# Patient Record
Sex: Female | Born: 1960 | Race: White | Hispanic: No | Marital: Single | Smoking: Current every day smoker
Health system: Southern US, Community
[De-identification: ages and names within clinical notes are randomized; demographics above are authoritative.]

## PROBLEM LIST (undated history)

## (undated) DIAGNOSIS — M1990 Unspecified osteoarthritis, unspecified site: Secondary | ICD-10-CM

## (undated) DIAGNOSIS — R519 Headache, unspecified: Secondary | ICD-10-CM

## (undated) DIAGNOSIS — R51 Headache: Secondary | ICD-10-CM

## (undated) DIAGNOSIS — R1900 Intra-abdominal and pelvic swelling, mass and lump, unspecified site: Secondary | ICD-10-CM

## (undated) HISTORY — PX: WISDOM TOOTH EXTRACTION: SHX21

---

## 2005-10-10 ENCOUNTER — Emergency Department: Payer: Self-pay | Admitting: Emergency Medicine

## 2016-12-16 ENCOUNTER — Encounter: Payer: Self-pay | Admitting: Advanced Practice Midwife

## 2016-12-16 ENCOUNTER — Ambulatory Visit (INDEPENDENT_AMBULATORY_CARE_PROVIDER_SITE_OTHER): Payer: 59 | Admitting: Advanced Practice Midwife

## 2016-12-16 VITALS — BP 120/70 | HR 71 | Ht 63.0 in | Wt 124.0 lb

## 2016-12-16 DIAGNOSIS — Z01419 Encounter for gynecological examination (general) (routine) without abnormal findings: Secondary | ICD-10-CM

## 2016-12-16 DIAGNOSIS — N9089 Other specified noninflammatory disorders of vulva and perineum: Secondary | ICD-10-CM | POA: Diagnosis not present

## 2016-12-16 DIAGNOSIS — Z124 Encounter for screening for malignant neoplasm of cervix: Secondary | ICD-10-CM

## 2016-12-16 NOTE — Progress Notes (Signed)
Patient ID: Tiffany Kelley, female   DOB: 1960/07/11, 56 y.o.   MRN: [id number]     Gynecology Annual Exam  PCP: Patient, No Pcp Per  Chief Complaint:  Chief Complaint  Patient presents with  . Gynecologic Exam    History of Present Illness:Patient is a 56 y.o. G0P0000 presents for annual exam. The patient has complaints today of 2 small skin tags on the perineum. She requests that they be removed.   LMP: No LMP recorded. Patient is post menopausal Menarche:not applicable Postcoital Bleeding: no Dysmenorrhea: not applicable   The patient is sexually active. She denies dyspareunia.  The patient does perform self breast exams.  There is no notable family history of breast or ovarian cancer in her family.  The patient wears seatbelts: yes.   The patient has regular exercise: yes.    The patient denies current symptoms of depression.     Review of Systems: Review of Systems  Constitutional: Negative.   HENT: Negative.   Eyes: Negative.   Respiratory: Negative.   Cardiovascular: Negative.   Gastrointestinal: Negative.   Genitourinary: Negative.   Musculoskeletal: Negative.   Skin: Negative.   Neurological: Negative.   Endo/Heme/Allergies: Negative.   Psychiatric/Behavioral: Negative.     Past Medical History:  History reviewed. No pertinent past medical history.  Past Surgical History:  History reviewed. No pertinent surgical history.  Gynecologic History:  No LMP recorded. Last Pap: about 15 years ago Results were:  no abnormalities  Last mammogram: 4 years ago Results were: BI-RAD I Obstetric History: G0P0000  Family History:  History reviewed. No pertinent family history.  Social History:  Social History   Social History  . Marital status: Single    Spouse name: N/A  . Number of children: N/A  . Years of education: N/A   Occupational History  . Not on file.   Social History Main Topics  . Smoking status: Current Every Day Smoker  . Smokeless  tobacco: Never Used  . Alcohol use No  . Drug use: No  . Sexual activity: Not on file   Other Topics Concern  . Not on file   Social History Narrative  . No narrative on file    Allergies:  No Known Allergies  Medications: Prior to Admission medications   Not on File    Physical Exam Vitals: Blood pressure 120/70, pulse 71, height 5\' 3"  (1.6 m), weight 124 lb (56.2 kg).  General: NAD HEENT: normocephalic, anicteric Thyroid: no enlargement, no palpable nodules Pulmonary: No increased work of breathing, CTAB Cardiovascular: RRR, distal pulses 2+ Breast: Breast symmetrical, no tenderness, no palpable nodules or masses, no skin or nipple retraction present, no nipple discharge.  No axillary or supraclavicular lymphadenopathy. Abdomen: NABS, soft, non-tender, non-distended.  Umbilicus without lesions.  No hepatomegaly, splenomegaly or masses palpable. No evidence of hernia  Genitourinary:  External: Normal external female genitalia.  Normal urethral meatus, normal,   Bartholin's and Skene's glands. 3 small skin tags removed with good results. 1 stitch                      placed with hemostasis noted.   Vagina: Normal vaginal mucosa, no evidence of prolapse.    Cervix: Grossly normal in appearance, no bleeding, no CMT  Uterus: Non-enlarged, mobile, normal contour.    Adnexa: ovaries non-enlarged, no adnexal masses  Rectal: deferred  Lymphatic: no evidence of inguinal lymphadenopathy Extremities: no edema, erythema, or tenderness Neurologic: Grossly intact Psychiatric: mood appropriate,  affect full    Assessment: 57 y.o. G0P0000 routine annual exam  Plan: Problem List Items Addressed This Visit    None    Visit Diagnoses    Well woman exam with routine gynecological exam    -  Primary   Relevant Orders   IGP, Aptima HPV   Ambulatory referral to Gastroenterology   Cervical cancer screening       Relevant Orders   IGP, Aptima HPV      1) Mammogram - recommend  yearly screening mammogram.  Mammogram patient declines mammogram at this time  2) STI screening was offered and declined  3) ASCCP guidelines and rational discussed.  Patient opts for occasional screening interval  4) Osteoporosis  - per USPTF routine screening DEXA at age 64   Consider FDA-approved medical therapies in postmenopausal women and men aged 36 years and older, based on the following: a) A hip or vertebral (clinical or morphometric) fracture b) T-score ? -2.5 at the femoral neck or spine after appropriate evaluation to exclude secondary causes C) Low bone mass (T-score between -1.0 and -2.5 at the femoral neck or spine) and a 10-year probability of a hip fracture ? 3% or a 10-year probability of a major osteoporosis-related fracture ? 20% based on the [place]-adapted WHO algorithm   5) Routine healthcare maintenance including cholesterol, diabetes screening discussed Declines  6) Colonoscopy: referral sent today.  Screening recommended starting at age 24 for average risk individuals, age 49 for individuals deemed at increased risk (including African Americans) and recommended to continue until age 83.  For patient age 70-85 individualized approach is recommended.  Gold standard screening is via colonoscopy, Cologuard screening is an acceptable alternative for patient unwilling or unable to undergo colonoscopy.  "Colorectal cancer screening for average?risk adults: 2018 guideline update from the American Cancer Society"[place]: A Cancer Journal for Clinicians: Aug 19, 2016   7) Follow up 1 year for routine annual  Rod Can, [place]

## 2016-12-18 LAB — IGP, APTIMA HPV
HPV Aptima: NEGATIVE
PAP Smear Comment: 0

## 2017-01-29 ENCOUNTER — Telehealth: Payer: Self-pay | Admitting: Gastroenterology

## 2017-01-29 NOTE — Telephone Encounter (Signed)
PCP is wanting this procedure schedule.

## 2017-02-02 ENCOUNTER — Telehealth: Payer: Self-pay

## 2017-02-02 NOTE — Telephone Encounter (Signed)
LVM with family member for pt callback.   Pt attempting to schedule colonoscopy.

## 2017-06-27 DIAGNOSIS — N949 Unspecified condition associated with female genital organs and menstrual cycle: Secondary | ICD-10-CM

## 2017-06-27 DIAGNOSIS — F17210 Nicotine dependence, cigarettes, uncomplicated: Secondary | ICD-10-CM | POA: Diagnosis not present

## 2017-06-27 DIAGNOSIS — R102 Pelvic and perineal pain: Secondary | ICD-10-CM | POA: Diagnosis not present

## 2017-06-28 ENCOUNTER — Emergency Department: Payer: 59

## 2017-06-28 ENCOUNTER — Other Ambulatory Visit: Payer: Self-pay | Admitting: Obstetrics and Gynecology

## 2017-06-28 ENCOUNTER — Emergency Department
Admission: EM | Admit: 2017-06-28 | Discharge: 2017-06-28 | Disposition: A | Discharge status: Home / Self Care | Payer: 59 | Attending: Emergency Medicine | Admitting: Emergency Medicine

## 2017-06-28 ENCOUNTER — Other Ambulatory Visit: Payer: Self-pay

## 2017-06-28 DIAGNOSIS — R52 Pain, unspecified: Secondary | ICD-10-CM

## 2017-06-28 DIAGNOSIS — N838 Other noninflammatory disorders of ovary, fallopian tube and broad ligament: Secondary | ICD-10-CM | POA: Insufficient documentation

## 2017-06-28 DIAGNOSIS — N9489 Other specified conditions associated with female genital organs and menstrual cycle: Secondary | ICD-10-CM

## 2017-06-28 DIAGNOSIS — F17210 Nicotine dependence, cigarettes, uncomplicated: Secondary | ICD-10-CM | POA: Insufficient documentation

## 2017-06-28 DIAGNOSIS — R1031 Right lower quadrant pain: Secondary | ICD-10-CM | POA: Diagnosis present

## 2017-06-28 DIAGNOSIS — N949 Unspecified condition associated with female genital organs and menstrual cycle: Secondary | ICD-10-CM | POA: Diagnosis not present

## 2017-06-28 DIAGNOSIS — R102 Pelvic and perineal pain: Secondary | ICD-10-CM | POA: Diagnosis not present

## 2017-06-28 LAB — COMPREHENSIVE METABOLIC PANEL
ALBUMIN: 4.2 g/dL (ref 3.5–5.0)
ALK PHOS: 110 U/L (ref 38–126)
ALT: 25 U/L (ref 14–54)
AST: 34 U/L (ref 15–41)
Anion gap: 11 (ref 5–15)
BILIRUBIN TOTAL: 0.5 mg/dL (ref 0.3–1.2)
BUN: 16 mg/dL (ref 6–20)
CALCIUM: 9.3 mg/dL (ref 8.9–10.3)
CO2: 21 mmol/L — ABNORMAL LOW (ref 22–32)
Chloride: 107 mmol/L (ref 101–111)
Creatinine, Ser: 0.84 mg/dL (ref 0.44–1.00)
GFR calc Af Amer: >60 mL/min (ref >60)
GFR calc non Af Amer: >60 mL/min (ref >60)
GLUCOSE: 129 mg/dL — AB (ref 65–99)
Potassium: 3.3 mmol/L — ABNORMAL LOW (ref 3.5–5.1)
Sodium: 139 mmol/L (ref 135–145)
TOTAL PROTEIN: 7.2 g/dL (ref 6.5–8.1)

## 2017-06-28 LAB — CBC
HCT: 40.7 % (ref 35.0–47.0)
Hemoglobin: 13.8 g/dL (ref 12.0–16.0)
MCH: 30.8 pg (ref 26.0–34.0)
MCHC: 34 g/dL (ref 32.0–36.0)
MCV: 90.8 fL (ref 80.0–100.0)
PLATELETS: 171 10*3/uL (ref 150–440)
RBC: 4.48 MIL/uL (ref 3.80–5.20)
RDW: 13.5 % (ref 11.5–14.5)
WBC: 7.6 10*3/uL (ref 3.6–11.0)

## 2017-06-28 LAB — URINALYSIS, COMPLETE (UACMP) WITH MICROSCOPIC
Bacteria, UA: NONE SEEN
Bilirubin Urine: NEGATIVE
GLUCOSE, UA: NEGATIVE mg/dL
Hgb urine dipstick: NEGATIVE
Ketones, ur: 5 mg/dL — AB
Leukocytes, UA: NEGATIVE
Nitrite: NEGATIVE
Protein, ur: NEGATIVE mg/dL
SPECIFIC GRAVITY, URINE: 1.017 (ref 1.005–1.030)
WBC, UA: NONE SEEN WBC/hpf (ref 0–5)
pH: 6 (ref 5.0–8.0)

## 2017-06-28 LAB — LIPASE, BLOOD: Lipase: 30 U/L (ref 11–51)

## 2017-06-28 MED ORDER — ONDANSETRON 4 MG PO TBDP: 4.0000 mg | ORAL_TABLET | Freq: Three times a day (TID) | ORAL | 0 refills | Status: DC | PRN

## 2017-06-28 MED ORDER — ONDANSETRON HCL 4 MG/2ML IJ SOLN
4.0000 mg | Freq: Once | INTRAMUSCULAR | Status: AC | PRN
  Administered 2017-06-28: 4 mg via INTRAVENOUS
  Filled 2017-06-28: qty 2

## 2017-06-28 MED ORDER — ONDANSETRON HCL 4 MG/2ML IJ SOLN
4.0000 mg | Freq: Once | INTRAMUSCULAR | Status: AC
  Administered 2017-06-28: 4 mg via INTRAVENOUS
  Filled 2017-06-28: qty 2

## 2017-06-28 MED ORDER — HYDROMORPHONE HCL 1 MG/ML IJ SOLN
0.5000 mg | Freq: Once | INTRAMUSCULAR | Status: AC
  Administered 2017-06-28: 0.5 mg via INTRAVENOUS

## 2017-06-28 MED ORDER — HYDROMORPHONE HCL 1 MG/ML IJ SOLN
INTRAMUSCULAR | Status: AC
  Filled 2017-06-28: qty 1

## 2017-06-28 MED ORDER — SODIUM CHLORIDE 0.9 % IV BOLUS
1000.0000 mL | Freq: Once | INTRAVENOUS | Status: AC
  Administered 2017-06-28: 1000 mL via INTRAVENOUS

## 2017-06-28 MED ORDER — HYDROMORPHONE HCL 1 MG/ML IJ SOLN
1.0000 mg | Freq: Once | INTRAMUSCULAR | Status: AC
  Administered 2017-06-28: 1 mg via INTRAVENOUS
  Filled 2017-06-28: qty 1

## 2017-06-28 MED ORDER — OXYCODONE-ACETAMINOPHEN 5-325 MG PO TABS: 1.0000 | ORAL_TABLET | ORAL | 0 refills | Status: DC | PRN

## 2017-06-28 NOTE — ED Notes (Signed)
Pt returned from ultrasound

## 2017-06-28 NOTE — ED Provider Notes (Addendum)
-----------------------------------------   10:05 AM on 06/28/2017 -----------------------------------------  I discussed the ultrasound/CT results with the patient.  I discussed the patient with Dr. Prentice Docker of OB/GYN.  Patient states her pain is down to a 3/10 but it continues to come back and be intermittent.  Due to the severe pain this morning patient wishes to stay in the emergency department to speak with Dr. Glennon Mac after he is done at clinic which should be around 12 PM per Dr. Glennon Mac.  Patient agreeable to this plan of care.   Harvest Dark, MD 06/28/17 1006   ----------------------------------------- 1:09 PM on 06/28/2017 -----------------------------------------  Patient has been seen by Dr. Glennon Mac, okay for discharge home with follow-up in the office on Wednesday to gynecology oncology.  We will discharge with pain and nausea medication.  We will also discharged with a work note.  Patient agreeable to this plan of care.  I discussed these plans with the patient.   Harvest Dark, MD 06/28/17 1310

## 2017-06-28 NOTE — Consult Note (Signed)
GYNECOLOGY CONSULT NOTE  GYN Consultation  Attending Provider: Harvest Dark, MD  Madysen Faircloth [phone] 06/28/2017 7:39 PM    Reason for Consultation:   Tiffany Kelley is a 57 y.o. G0P0000 postmenopausal female seen at the request of Dr. Kerman Passey for evaluation of a pelvic mass.    History of Present Ilness:   The patient was in her usual state of good health until last night when she began to have a twinge in her right side.  She went to bed, but was awakened by severe right lower quadrant abdominal pain that radiated around her right side to her right lower back. The pain in her right lower quadrant was 8/10 and in her back it was 3-4/10.  The pain was described as sharp and stabbing in her right lower quadrant and as a dull ache in her back. The pain would come and ago and was associated with intermittent nausea and emesis.  By 4am today she was concerned and so presented to the ER.  Lying in the fetal position, but facing the mattress seemed to make the pain a little better. Nothing seemed to make it worse.  Apart from nausea, she has had no other symptoms. She denies recently weight loss, bloating, early satiety, and constipation.   She received dilaudid for pain control in the ED and has felt much better, she requires nothing at the moment for pain.   She has not had a menses in 10 years. She is in a same-sex relationship and is a [place] [employer] [place].  She had a recent pap smear in 11/2016, which was normal.  Denies a history of abnormal pap smears.   Past Medical History: Denies   Past Surgical History: Denies  Allergies: No Known Allergies   Prior to Admission medications: denies   Obstetric History: She is a G0P0000.    Social History:  She  reports that she has been smoking.  She has never used smokeless tobacco. She reports that she does not drink alcohol or use drugs.  Family History:  Breast cancer in her maternal aunt. Denies other family history of  neoplasia.   Review of Systems:   Review of Systems  Constitutional: Negative.   HENT: Negative.   Eyes: Negative.   Respiratory: Negative.   Cardiovascular: Negative.   Gastrointestinal: Positive for abdominal pain, nausea and vomiting. Negative for blood in stool, constipation, diarrhea, heartburn and melena.  Genitourinary: Positive for flank pain and frequency. Negative for dysuria, hematuria and urgency.  Skin: Negative.   Neurological: Negative.   Endo/Heme/Allergies: Negative.   Psychiatric/Behavioral: Negative.      Objective    BP 105/61   Pulse (!) 48   Temp 97.7 F (36.5 C) (Oral)   Resp 18   Ht 5\' 3"  (1.6 m)   Wt 122 lb (55.3 kg)   SpO2 95%   BMI 21.61 kg/m  Physical Exam  Physical Exam  Constitutional: She is oriented to person, place, and time. She appears well-developed and well-nourished. She does not appear ill.  HENT:  Head: Normocephalic and atraumatic.  Eyes: EOM are normal. No scleral icterus.  Cardiovascular: Normal rate and regular rhythm. Exam reveals no gallop.  No murmur heard. Pulmonary/Chest: Effort normal and breath sounds normal. No respiratory distress. She has no wheezes. She has no rhonchi. She has no rales. She exhibits no tenderness.  Abdominal: Soft. Normal appearance and bowel sounds are normal. She exhibits mass (mid-lower abdomen to right lower abdomen). She exhibits no  distension, no fluid wave, no ascites and no pulsatile midline mass. There is tenderness (mild ttp) in the right lower quadrant. There is no rigidity, no rebound, no guarding and no CVA tenderness.  Genitourinary:  Genitourinary Comments: Deferred in favor follow up pelvic exam in clinic  Neurological: She is alert and oriented to person, place, and time.  Skin: Skin is warm and dry. She is not diaphoretic.  Psychiatric: She has a normal mood and affect. Her behavior is normal.   Laboratory Results:   Lab Results  Component Value Date   WBC 7.6 06/28/2017   RBC  4.48 06/28/2017   HGB 13.8 06/28/2017   HCT 40.7 06/28/2017   PLT 171 06/28/2017   NA 139 06/28/2017   K 3.3 (L) 06/28/2017   CREATININE 0.84 06/28/2017   Imaging Results:  Ct Renal Stone Study  Result Date: 06/28/2017 CLINICAL DATA:  57 y/o  F; right flank pain. EXAM: CT ABDOMEN AND PELVIS WITHOUT CONTRAST TECHNIQUE: Multidetector CT imaging of the abdomen and pelvis was performed following the standard protocol without IV contrast. COMPARISON:  None. FINDINGS: Lower chest: No acute abnormality. Hepatobiliary: Subcentimeter lucency in segment 5 (series 2, image 30), likely a cyst. Otherwise no focal liver abnormality is seen. No gallstones, gallbladder wall thickening, or biliary dilatation. Pancreas: Unremarkable. No pancreatic ductal dilatation or surrounding inflammatory changes. Spleen: Normal in size without focal abnormality. Adrenals/Urinary Tract: Adrenal glands are unremarkable. Subcentimeter lucency within the upper pole of left kidney, likely cyst. Otherwise kidneys are normal, without renal calculi, focal lesion, or hydronephrosis. Bladder is unremarkable. Stomach/Bowel: Stomach is within normal limits. Appendix appears normal. No evidence of bowel wall thickening, distention, or inflammatory changes. Vascular/Lymphatic: Aortic atherosclerosis. No enlarged abdominal or pelvic lymph nodes. Reproductive: Multiloculated cystic mass, likely arising from the right adnexa with thick septations and solid components in the wall measuring up to 12.9 x 10.4 x 6.9 cm. Other: No abdominal wall hernia or abnormality. No abdominopelvic ascites. Musculoskeletal: No fracture is seen. Mild lumbar spondylosis and transitional L5 vertebral body. IMPRESSION: 1. Complex cystic mass in the pelvis likely arising from right adnexa measuring up to 12.9 cm. Surgical consultation is recommended. 2. No acute process identified. No urinary stone disease or hydronephrosis. Electronically Signed   By: Kristine Garbe M.D.   On: 06/28/2017 06:58   US Pelvic Complete With Transvaginal  Result Date: 06/28/2017 CLINICAL DATA:  Pelvic pain EXAM: TRANSABDOMINAL AND TRANSVAGINAL ULTRASOUND OF PELVIS TECHNIQUE: Study was performed transabdominally to optimize pelvic field of view evaluation and transvaginally to optimize internal visceral architecture evaluation. COMPARISON:  None FINDINGS: Uterus Measurements: 5.0 x 1.6 x 2.2 cm uterus is compressed by a large adnexal mass. No intrauterine lesion evident. Endometrium Endometrium not well seen due to compressed uterus. There does not appear to be endometrial thickening. Right ovary Not visualized as separate structure. Left ovary Not visualized as separate structure. Other findings There is a midline complex predominantly cystic mass measuring 12.7 x 8.1 x 12.0 cm. This complex mass impresses upon the uterus and may displace ovaries such that they are not visualized by either transabdominal or transvaginal technique. No other well-defined pelvic mass evident. A small amount of adjacent free fluid is noted. IMPRESSION: Complex mass in the midline of the pelvis measuring 12.7 x 8.1 x 12.0 cm. It is not possible on this study to ascertain whether arises from the right versus left side. It impresses upon the uterus. 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 A large endometrioma or hemorrhagic cystic lesion are differential considerations. Small amount of free pelvic fluid noted. Given the overall nature of this lesion and its displacement of other structures, it may be prudent to consider correlation with CT or MR for further assessment. CT or MR potentially could localize right versus left side as origin of this large complex lesion. Electronically Signed   By: Lowella Grip III M.D.   On: 06/28/2017 08:53     Assessment & Recommendations   Berneta Sconyers is a 57 y.o. G0P0000  postmenopausal female with a newly diagnosed pelvic mass, possibly arising from the right ovary, but not definitively on imaging. I discussed with the patient and her partner all findings. We discussed that the mass is complicated in appearance. However, there is no way to definitively say whether the mass is malignant without removal.  Reassuring findings are that there appears to be no significant ascites, omental caking, or obvious para-aortic or pelvic lymph nodes (though the CT protocol may have limited the ability to appreciate these areas fully).  On ultrasound the lesion has a cystic component and a ground-glass component.  It is difficult to say whether there is one cyst verses more than one.  There is doppler flow on what appears to be a septation. The cystic portion does also have small papillary projections, making the findings more concerning.   Recommendations:  1.  I will refer the patient to the Gynecologic Oncology clinic ASAP for further evaluation and consideration of surgical removal. I have added tumor markers to the patients labs. 2.  OK to send the patient home with a small amount of pain medication to help her with pain for today. 3.  Precautions given regarding torsion and she is to return immediately to the ER should she develop intractable pain or other concerning symptoms.   4. She and her partner voiced understanding of the findings as I have explained them and all questions were answered.  I reviewed all images and labs and further workup is planned by way of a surgical route, most likely, in consultation with gynecologic oncology.  Prentice Docker, MD 06/28/2017 7:39 PM

## 2017-06-28 NOTE — ED Triage Notes (Signed)
Pt reports one episode of sharp pain to right lower quadrant Sunday morning; had a dull aching pain all day; around 2am woke up vomiting; pt says pain starts lower mid abd and wraps around flank to right mid back; denies urinary symptoms; denies history of stone;

## 2017-06-28 NOTE — ED Provider Notes (Signed)
[facility] Emergency Department Provider Note   ____________________________________________   First MD Initiated Contact with Patient 06/28/17 [phone]     (approximate)  I have reviewed the triage vital signs and the nursing notes.   HISTORY  Chief Complaint Abdominal Pain and Back Pain    HPI Tiffany Kelley is a 57 y.o. female who presents to the ED from home with a chief complaint of right lower quadrant and right flank pain.  Patient reports she had a sudden episode of sharp right lower quadrant abdominal pain yesterday morning.  The pain quickly disappeared and she went about her day.  She had a dull aching pain in her right flank all day.  She was able to urinate freely all day without hematuria and ate a good dinner.  Woke up around 2 AM with right flank and lower quadrant abdominal pain associated with vomiting.  Denies vaginal discharge or bleeding. Denies associated fever, chills, chest pain, shortness of breath, diarrhea.  Had a very satisfying bowel movement last evening.  Denies recent travel or trauma.  Denies personal history of kidney stones.   Past medical history None  There are no active problems to display for this patient.   Past surgical history None  Prior to Admission medications   Not on File    Allergies Patient has no known allergies.  No family history on file.  Social History Social History   Tobacco Use  . Smoking status: Current Every Day Smoker  . Smokeless tobacco: Never Used  Substance Use Topics  . Alcohol use: No  . Drug use: No    Review of Systems  Constitutional: No fever/chills. Eyes: No visual changes. ENT: No sore throat. Cardiovascular: Denies chest pain. Respiratory: Denies shortness of breath. Gastrointestinal: Positive for right flank and abdominal pain.  Positive for vomiting.  No diarrhea.  No constipation. Genitourinary: Negative for dysuria. Musculoskeletal: Negative for back  pain. Skin: Negative for rash. Neurological: Negative for headaches, focal weakness or numbness.   ____________________________________________   PHYSICAL EXAM:  VITAL SIGNS: ED Triage Vitals  Enc Vitals Group     BP 06/28/17 0457 (!) 147/77     Pulse Rate 06/28/17 0457 (!) 56     Resp 06/28/17 0457 18     Temp 06/28/17 0457 97.7 F (36.5 C)     Temp Source 06/28/17 0457 Oral     SpO2 06/28/17 0457 100 %     Weight 06/28/17 0457 122 lb (55.3 kg)     Height 06/28/17 0457 5\' 3"  (1.6 m)     Head Circumference --      Peak Flow --      Pain Score 06/28/17 0506 8     Pain Loc --      Pain Edu? --      Excl. in [place]? --     Constitutional: Alert and oriented. Well appearing and in moderate acute distress.  Rocking back and forth in the bed with her partner rubbing her back. Eyes: Conjunctivae are normal. PERRL. EOMI. Head: Atraumatic. Nose: No congestion/rhinnorhea. Mouth/Throat: Mucous membranes are moist.  Oropharynx non-erythematous. Neck: No stridor.   Cardiovascular: Normal rate, regular rhythm. Grossly normal heart sounds.  Good peripheral circulation. Respiratory: Normal respiratory effort.  No retractions. Lungs CTAB. Gastrointestinal: Soft and moderately tender to palpation right lower quadrant without rebound or guarding. No distention. No abdominal bruits. No CVA tenderness. Musculoskeletal: No lower extremity tenderness nor edema.  No joint effusions. Neurologic:  Normal speech  and language. No gross focal neurologic deficits are appreciated. No gait instability. Skin:  Skin is warm, dry and intact. No rash noted. Psychiatric: Mood and affect are normal. Speech and behavior are normal.  ____________________________________________   LABS (all labs ordered are listed, but only abnormal results are displayed)  Labs Reviewed  COMPREHENSIVE METABOLIC PANEL - Abnormal; Notable for the following components:      Result Value   Potassium 3.3 (*)    CO2 21 (*)     Glucose, Bld 129 (*)    All other components within normal limits  LIPASE, BLOOD  CBC  URINALYSIS, COMPLETE (UACMP) WITH MICROSCOPIC   ____________________________________________  EKG  None ____________________________________________  RADIOLOGY  ED MD interpretation: CT with large right cystic adnexal mass; normal appendix; no kidney stones  Official radiology report(s): Ct Renal Stone Study  Result Date: 06/28/2017 CLINICAL DATA:  57 y/o  F; right flank pain. EXAM: CT ABDOMEN AND PELVIS WITHOUT CONTRAST TECHNIQUE: Multidetector CT imaging of the abdomen and pelvis was performed following the standard protocol without IV contrast. COMPARISON:  None. FINDINGS: Lower chest: No acute abnormality. Hepatobiliary: Subcentimeter lucency in segment 5 (series 2, image 30), likely a cyst. Otherwise no focal liver abnormality is seen. No gallstones, gallbladder wall thickening, or biliary dilatation. Pancreas: Unremarkable. No pancreatic ductal dilatation or surrounding inflammatory changes. Spleen: Normal in size without focal abnormality. Adrenals/Urinary Tract: Adrenal glands are unremarkable. Subcentimeter lucency within the upper pole of left kidney, likely cyst. Otherwise kidneys are normal, without renal calculi, focal lesion, or hydronephrosis. Bladder is unremarkable. Stomach/Bowel: Stomach is within normal limits. Appendix appears normal. No evidence of bowel wall thickening, distention, or inflammatory changes. Vascular/Lymphatic: Aortic atherosclerosis. No enlarged abdominal or pelvic lymph nodes. Reproductive: Multiloculated cystic mass, likely arising from the right adnexa with thick septations and solid components in the wall measuring up to 12.9 x 10.4 x 6.9 cm. Other: No abdominal wall hernia or abnormality. No abdominopelvic ascites. Musculoskeletal: No fracture is seen. Mild lumbar spondylosis and transitional L5 vertebral body. IMPRESSION: 1. Complex cystic mass in the pelvis likely  arising from right adnexa measuring up to 12.9 cm. Surgical consultation is recommended. 2. No acute process identified. No urinary stone disease or hydronephrosis. Electronically Signed   By: Kristine Garbe M.D.   On: 06/28/2017 06:58    ____________________________________________   PROCEDURES  Procedure(s) performed:  None  Procedures  Critical Care performed: No  ____________________________________________   INITIAL IMPRESSION / ASSESSMENT AND PLAN / ED COURSE  As part of my medical decision making, I reviewed the following data within the [facility] History obtained from family, Nursing notes reviewed and incorporated, Labs reviewed, Radiograph reviewed and Notes from prior ED visits   57 year old female who presents with right flank to right lower quadrant abdominal pain. Differential diagnosis includes, but is not limited to, ovarian cyst, ovarian torsion, acute appendicitis, diverticulitis, urinary tract infection/pyelonephritis, endometriosis, bowel obstruction, colitis, renal colic, gastroenteritis, hernia, fibroids, endometriosis, pregnancy related pain including ectopic pregnancy, etc.   Laboratory results unremarkable.  Patient has not yet urinated to provide a specimen.  Pain returning after initial dose of Dilaudid.  Clinical picture more suggestive of renal colic.  Will proceed with CT renal colic protocol.  Clinical Course as of Jun 29 723  Mon Jun 28, 2017  0723 Updated patient and her partner of CT imaging results.  Plan to proceed with pelvic ultrasound followed by [facility] consultation.  Care and disposition transferred to Dr. Kerman Passey.   [  JS]    Clinical Course User Index [JS] Paulette Blanch, MD     ____________________________________________   FINAL CLINICAL IMPRESSION(S) / ED DIAGNOSES  Final diagnoses:  Pain  Pelvic pain in female  Adnexal mass     ED Discharge Orders    None       Note:  This  document was prepared using Dragon voice recognition software and may include unintentional dictation errors.    Paulette Blanch, MD 06/28/17 [phone]

## 2017-06-28 NOTE — Discharge Instructions (Addendum)
Please follow-up with gynecology/oncology on Wednesday as scheduled.  Return to the emergency department for any acute worsening of pain or if you are unable to tolerate the pain at home.  Please take your pain and nausea medication as needed, as prescribed.

## 2017-06-28 NOTE — ED Notes (Signed)
Ultrasound states they require a urine pregnancy, pt states she is homosexual and there is no way she is pregnant and refuses to pay for a urine pregnancy test. Informed ultrasound.Marland Kitchen

## 2017-06-28 NOTE — H&P (View-Only) (Signed)
GYNECOLOGY CONSULT NOTE  GYN Consultation  Attending Provider: Harvest Dark, MD  Tiffany Kelley [phone] 06/28/2017 7:39 PM    Reason for Consultation:   Tiffany Kelley is a 57 y.o. G0P0000 postmenopausal female seen at the request of Dr. Kerman Passey for evaluation of a pelvic mass.    History of Present Ilness:   The patient was in her usual state of good health until last night when she began to have a twinge in her right side.  She went to bed, but was awakened by severe right lower quadrant abdominal pain that radiated around her right side to her right lower back. The pain in her right lower quadrant was 8/10 and in her back it was 3-4/10.  The pain was described as sharp and stabbing in her right lower quadrant and as a dull ache in her back. The pain would come and ago and was associated with intermittent nausea and emesis.  By 4am today she was concerned and so presented to the ER.  Lying in the fetal position, but facing the mattress seemed to make the pain a little better. Nothing seemed to make it worse.  Apart from nausea, she has had no other symptoms. She denies recently weight loss, bloating, early satiety, and constipation.   She received dilaudid for pain control in the ED and has felt much better, she requires nothing at the moment for pain.   She has not had a menses in 10 years. She is in a same-sex relationship and is a [place] [employer] [place].  She had a recent pap smear in 11/2016, which was normal.  Denies a history of abnormal pap smears.   Past Medical History: Denies   Past Surgical History: Denies  Allergies: No Known Allergies   Prior to Admission medications: denies   Obstetric History: She is a G0P0000.    Social History:  She  reports that she has been smoking.  She has never used smokeless tobacco. She reports that she does not drink alcohol or use drugs.  Family History:  Breast cancer in her maternal aunt. Denies other family history of  neoplasia.   Review of Systems:   Review of Systems  Constitutional: Negative.   HENT: Negative.   Eyes: Negative.   Respiratory: Negative.   Cardiovascular: Negative.   Gastrointestinal: Positive for abdominal pain, nausea and vomiting. Negative for blood in stool, constipation, diarrhea, heartburn and melena.  Genitourinary: Positive for flank pain and frequency. Negative for dysuria, hematuria and urgency.  Skin: Negative.   Neurological: Negative.   Endo/Heme/Allergies: Negative.   Psychiatric/Behavioral: Negative.      Objective    BP 105/61   Pulse (!) 48   Temp 97.7 F (36.5 C) (Oral)   Resp 18   Ht 5\' 3"  (1.6 m)   Wt 122 lb (55.3 kg)   SpO2 95%   BMI 21.61 kg/m  Physical Exam  Physical Exam  Constitutional: She is oriented to person, place, and time. She appears well-developed and well-nourished. She does not appear ill.  HENT:  Head: Normocephalic and atraumatic.  Eyes: EOM are normal. No scleral icterus.  Cardiovascular: Normal rate and regular rhythm. Exam reveals no gallop.  No murmur heard. Pulmonary/Chest: Effort normal and breath sounds normal. No respiratory distress. She has no wheezes. She has no rhonchi. She has no rales. She exhibits no tenderness.  Abdominal: Soft. Normal appearance and bowel sounds are normal. She exhibits mass (mid-lower abdomen to right lower abdomen). She exhibits no  distension, no fluid wave, no ascites and no pulsatile midline mass. There is tenderness (mild ttp) in the right lower quadrant. There is no rigidity, no rebound, no guarding and no CVA tenderness.  Genitourinary:  Genitourinary Comments: Deferred in favor follow up pelvic exam in clinic  Neurological: She is alert and oriented to person, place, and time.  Skin: Skin is warm and dry. She is not diaphoretic.  Psychiatric: She has a normal mood and affect. Her behavior is normal.   Laboratory Results:   Lab Results  Component Value Date   WBC 7.6 06/28/2017   RBC  4.48 06/28/2017   HGB 13.8 06/28/2017   HCT 40.7 06/28/2017   PLT 171 06/28/2017   NA 139 06/28/2017   K 3.3 (L) 06/28/2017   CREATININE 0.84 06/28/2017   Imaging Results:  Ct Renal Stone Study  Result Date: 06/28/2017 CLINICAL DATA:  57 y/o  F; right flank pain. EXAM: CT ABDOMEN AND PELVIS WITHOUT CONTRAST TECHNIQUE: Multidetector CT imaging of the abdomen and pelvis was performed following the standard protocol without IV contrast. COMPARISON:  None. FINDINGS: Lower chest: No acute abnormality. Hepatobiliary: Subcentimeter lucency in segment 5 (series 2, image 30), likely a cyst. Otherwise no focal liver abnormality is seen. No gallstones, gallbladder wall thickening, or biliary dilatation. Pancreas: Unremarkable. No pancreatic ductal dilatation or surrounding inflammatory changes. Spleen: Normal in size without focal abnormality. Adrenals/Urinary Tract: Adrenal glands are unremarkable. Subcentimeter lucency within the upper pole of left kidney, likely cyst. Otherwise kidneys are normal, without renal calculi, focal lesion, or hydronephrosis. Bladder is unremarkable. Stomach/Bowel: Stomach is within normal limits. Appendix appears normal. No evidence of bowel wall thickening, distention, or inflammatory changes. Vascular/Lymphatic: Aortic atherosclerosis. No enlarged abdominal or pelvic lymph nodes. Reproductive: Multiloculated cystic mass, likely arising from the right adnexa with thick septations and solid components in the wall measuring up to 12.9 x 10.4 x 6.9 cm. Other: No abdominal wall hernia or abnormality. No abdominopelvic ascites. Musculoskeletal: No fracture is seen. Mild lumbar spondylosis and transitional L5 vertebral body. IMPRESSION: 1. Complex cystic mass in the pelvis likely arising from right adnexa measuring up to 12.9 cm. Surgical consultation is recommended. 2. No acute process identified. No urinary stone disease or hydronephrosis. Electronically Signed   By: Kristine Garbe M.D.   On: 06/28/2017 06:58   US Pelvic Complete With Transvaginal  Result Date: 06/28/2017 CLINICAL DATA:  Pelvic pain EXAM: TRANSABDOMINAL AND TRANSVAGINAL ULTRASOUND OF PELVIS TECHNIQUE: Study was performed transabdominally to optimize pelvic field of view evaluation and transvaginally to optimize internal visceral architecture evaluation. COMPARISON:  None FINDINGS: Uterus Measurements: 5.0 x 1.6 x 2.2 cm uterus is compressed by a large adnexal mass. No intrauterine lesion evident. Endometrium Endometrium not well seen due to compressed uterus. There does not appear to be endometrial thickening. Right ovary Not visualized as separate structure. Left ovary Not visualized as separate structure. Other findings There is a midline complex predominantly cystic mass measuring 12.7 x 8.1 x 12.0 cm. This complex mass impresses upon the uterus and may displace ovaries such that they are not visualized by either transabdominal or transvaginal technique. No other well-defined pelvic mass evident. A small amount of adjacent free fluid is noted. IMPRESSION: Complex mass in the midline of the pelvis measuring 12.7 x 8.1 x 12.0 cm. It is not possible on this study to ascertain whether arises from the right versus left side. It impresses upon the uterus. 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 A large endometrioma or hemorrhagic cystic lesion are differential considerations. Small amount of free pelvic fluid noted. Given the overall nature of this lesion and its displacement of other structures, it may be prudent to consider correlation with CT or MR for further assessment. CT or MR potentially could localize right versus left side as origin of this large complex lesion. Electronically Signed   By: Lowella Grip III M.D.   On: 06/28/2017 08:53     Assessment & Recommendations   Pama Roskos is a 57 y.o. G0P0000  postmenopausal female with a newly diagnosed pelvic mass, possibly arising from the right ovary, but not definitively on imaging. I discussed with the patient and her partner all findings. We discussed that the mass is complicated in appearance. However, there is no way to definitively say whether the mass is malignant without removal.  Reassuring findings are that there appears to be no significant ascites, omental caking, or obvious para-aortic or pelvic lymph nodes (though the CT protocol may have limited the ability to appreciate these areas fully).  On ultrasound the lesion has a cystic component and a ground-glass component.  It is difficult to say whether there is one cyst verses more than one.  There is doppler flow on what appears to be a septation. The cystic portion does also have small papillary projections, making the findings more concerning.   Recommendations:  1.  I will refer the patient to the Gynecologic Oncology clinic ASAP for further evaluation and consideration of surgical removal. I have added tumor markers to the patients labs. 2.  OK to send the patient home with a small amount of pain medication to help her with pain for today. 3.  Precautions given regarding torsion and she is to return immediately to the ER should she develop intractable pain or other concerning symptoms.   4. She and her partner voiced understanding of the findings as I have explained them and all questions were answered.  I reviewed all images and labs and further workup is planned by way of a surgical route, most likely, in consultation with gynecologic oncology.  Prentice Docker, MD 06/28/2017 7:39 PM

## 2017-06-29 LAB — POSTMENOPAUSAL INTERP: LOW

## 2017-06-29 LAB — OVARIAN MALIGNANCY RISK-ROMA
Cancer Antigen (CA) 125: 12.2 U/mL (ref 0.0–38.1)
HE4 (HUMAN EPID PROT 4): 97 pmol/L (ref 0.0–105.2)
PREMENOPAUSAL ROMA: 2.78 — AB
Postmenopausal ROMA: 1.82

## 2017-06-29 LAB — PREMENOPAUSAL INTERP: HIGH

## 2017-06-30 ENCOUNTER — Inpatient Hospital Stay: Payer: 59 | Attending: Obstetrics and Gynecology | Admitting: Obstetrics and Gynecology

## 2017-06-30 VITALS — BP 129/78 | HR 59 | Temp 97.7°F | Resp 18 | Ht 63.0 in | Wt 125.1 lb

## 2017-06-30 DIAGNOSIS — F17210 Nicotine dependence, cigarettes, uncomplicated: Secondary | ICD-10-CM

## 2017-06-30 DIAGNOSIS — D3911 Neoplasm of uncertain behavior of right ovary: Secondary | ICD-10-CM

## 2017-06-30 DIAGNOSIS — Z780 Asymptomatic menopausal state: Secondary | ICD-10-CM | POA: Diagnosis not present

## 2017-06-30 DIAGNOSIS — R1900 Intra-abdominal and pelvic swelling, mass and lump, unspecified site: Secondary | ICD-10-CM

## 2017-06-30 MED ORDER — OXYCODONE-ACETAMINOPHEN 5-325 MG PO TABS: 1.0000 | ORAL_TABLET | ORAL | 0 refills | Status: DC | PRN

## 2017-06-30 NOTE — Progress Notes (Signed)
Gynecologic Oncology Consult Visit   Referring Provider: Dr. Prentice Docker  Chief Complaint: pelvic mass  Subjective:  Myrna Vonseggern is a 57 y.o., G0P0000, post-menopausal, female who is seen in consultation from Dr. Glennon Mac for pelvic mass.   Initially she presentented to ER on 06/28/2017 for complaints of RLQ abdominal pelvic pain that radiated to her back with associated nausea and emesis.   Ct Renal Stone Study/Abdomen and Pelvis without Contrast 06/28/2017 FINDINGS: Reproductive: Multiloculated cystic mass, likely arising from the right adnexa with thick septations and solid components in the wall measuring up to 12.9 x 10.4 x 6.9 cm.  Other: No abdominal wall hernia or abnormality. No abdominopelvic ascites.  IMPRESSION: 1. Complex cystic mass in the pelvis likely arising from right adnexa measuring up to 12.9 cm. Surgical consultation is recommended. 2. No acute process identified. No urinary stone disease or hydronephrosis. Electronically Signed   By: Kristine Garbe M.D.   On: 06/28/2017 06:58   US Pelvic Complete With Transvaginal- 06/28/2017 IMPRESSION: Complex mass in the midline of the pelvis measuring 12.7 x 8.1 x 12.0 cm. It is not possible on this study to ascertain whether arises from the right versus left side. It impresses upon the uterus. An ovarian neoplasm, likely cystadenoma or cystadenocarcinoma, is felt to be the most likely etiology for this lesion. A large endometrioma or hemorrhagic cystic lesion are differential considerations. Small amount of free pelvic fluid noted. Electronically Signed   By: Lowella Grip III M.D.   On: 06/28/2017 08:53   06/28/2017 CA 125 = 12.2 HE4 = 97.0 Post-menopausal ROMA 1.82  Pap 11/2016 NILM and HPV negative. Denies history of abnormal pap smears.   Today, she reports feeling well. She continues to have right pelvic pain that radiates to her right lower back. Denies other specific complaints.  Taking Percocet 5/325  q4h.  Problem List: Patient Active Problem List   Diagnosis Date Noted  . Ovarian mass, right 06/28/2017    Past Medical History: History reviewed. No pertinent past medical history.  Past Surgical History: History reviewed. No pertinent surgical history.  Past Gynecologic History:  G0P0. LMP approximately 2009. In same-sex relationship. Denies history of STI or abnormal pap.    OB History:  OB History  Gravida Para Term Preterm AB Living  0 0 0 0 0 0  SAB TAB Ectopic Multiple Live Births  0 0 0 0 0    Family History: History reviewed. No pertinent family history.  Social History: Social History   Socioeconomic History  . Marital status: Single    Spouse name: Not on file  . Number of children: Not on file  . Years of education: Not on file  . Highest education level: Not on file  Occupational History  . Not on file  Social Needs  . Financial resource strain: Not on file  . Food insecurity:    Worry: Not on file    Inability: Not on file  . Transportation needs:    Medical: Not on file    Non-medical: Not on file  Tobacco Use  . Smoking status: Current Every Day Smoker  . Smokeless tobacco: Never Used  Substance and Sexual Activity  . Alcohol use: No  . Drug use: No  . Sexual activity: Not on file  Lifestyle  . Physical activity:    Days per week: Not on file    Minutes per session: Not on file  . Stress: Not on file  Relationships  . Social connections:  Talks on phone: Not on file    Gets together: Not on file    Attends religious service: Not on file    Active member of club or organization: Not on file    Attends meetings of clubs or organizations: Not on file    Relationship status: Not on file  . Intimate partner violence:    Fear of current or ex partner: Not on file    Emotionally abused: Not on file    Physically abused: Not on file    Forced sexual activity: Not on file  Other Topics Concern  . Not on file  Social History  Narrative  . Not on file  Works as a [employer] attorney.   She is a current smoker, smokes 15 cigarettes per day and continues to try to decrease her smoking.   Allergies: No Known Allergies  Current Medications: Current Outpatient Medications  Medication Sig Dispense Refill  . oxyCODONE-acetaminophen (PERCOCET) 5-325 MG tablet Take 1 tablet by mouth every 4 (four) hours as needed for severe pain. 20 tablet 0  . ondansetron (ZOFRAN ODT) 4 MG disintegrating tablet Take 1 tablet (4 mg total) by mouth every 8 (eight) hours as needed for nausea or vomiting. (Patient not taking: Reported on 06/30/2017) 20 tablet 0   No current facility-administered medications for this visit.     Review of Systems General: negative for, fevers, chills, fatigue, changes in sleep, changes in weight or appetite Skin: negative for changes in color, texture, moles or lesions Eyes: negative for, changes in vision, pain, diplopia HEENT: negative for, change in hearing, pain, discharge, tinnitus, vertigo, voice changes, sore throat, neck masses Breasts: negative for breast lumps Pulmonary: negative for, dyspnea, orthopnea, productive cough Cardiac: negative for, palpitations, syncope, pain, discomfort, pressure Gastrointestinal: negative for, dysphagia, nausea, vomiting, jaundice, pain, constipation, diarrhea, hematemesis, hematochezia Genitourinary/Sexual: negative for, dysuria, discharge, hesitancy, nocturia, retention, stones, infections, STD's, incontinence Ob/Gyn: positive for pelvic pain. Negative for bleeding or discharge.  Musculoskeletal: negative for, pain, stiffness, swelling, range of motion limitation Hematology: negative for, easy bruising, bleeding Neurologic/Psych: negative for, headaches, seizures, paralysis, weakness, tremor, change in gait, change in sensation, mood swings, depression, anxiety, change in memory  Objective:  Physical Examination:  BP 129/78 (BP Location: Left Arm, Patient  Position: Sitting)   Pulse (!) 59   Temp 97.7 F (36.5 C) (Tympanic)   Resp 18   Ht 5\' 3"  (1.6 m)   Wt 125 lb 1.6 oz (56.7 kg)   BMI 22.16 kg/m     ECOG Performance Status: 1 - Symptomatic but completely ambulatory  General appearance: alert, cooperative and appears stated age. Accompanied.  HEENT: normal thyroid, no adenopathy Lymph node survey: non-palpable, axillary, inguinal, supraclavicular Cardiovascular: Regular rate and rhythm.  Respiratory: clear to auscultation bilaterally.  Breast exam: not examined. Abdomen: mass palpable in lower abdomen, not tender. Back: inspection of back is normal Extremities: normal Skin exam - normal coloration and turgor, no rashes, no suspicious skin lesions noted. Neurological exam reveals alert, oriented, normal speech, no focal findings or movement disorder noted.  Pelvic: EGBUS: no lesions Cervix: no lesions, nontender, mobile Vagina: no lesions, no discharge or bleeding Uterus: normal size, nontender, mobile Adnexa: no palpable masses Rectovaginal: confirmatory    Assessment:  Ariyel Jeangilles is a 57 y.o. female diagnosed with multicystic painful ovarian mass.  Normal CA125 and HE4 (normal ROMA) suggest benign etiology, and no evidence of metastatic disease on CT scan, but the mass is not entirely cystic.  She  has significant pain and may have an element of torsion, but comfortable with Percocet.   Medical co-morbidities complicating care: none.  Plan:   Problem List Items Addressed This Visit    None    Visit Diagnoses    Pelvic mass in female    -  Primary     We discussed options for management including TLH, BSO for probable benign ovarian mass.  Dr. Theora Gianotti will assist Dr. Glennon Mac with the surgery in one week on 07/07/17.  If cancer found she will have surgical staging including possible pelvic/aortic node biopsies, omentectomy, bowel resection.  If patient has worsening of pain in the interim she can contact Dr. Glennon Mac and  he can do the surgery emergently without oncology involvement.   The risks of surgery were discussed in detail and she understands these to include infection; wound separation; hernia; vaginal cuff separation, injury to adjacent organs such as bowel, bladder, blood vessels, ureters and nerves; possible permanent ostomy; bleeding which may require blood transfusion; anesthesia risk; thromboembolic events; possible death; unforeseen complications; possible need for re-exploration; medical complications such as heart attack, stroke, pleural effusion and pneumonia; and, if staging performed the risk of lymphedema and lymphocyst.  The patient will receive DVT and antibiotic prophylaxis as indicated.  She voiced a clear understanding.  She had the opportunity to ask questions and written informed consent was obtained today.  The patient's diagnosis, an outline of the further diagnostic and laboratory studies which will be required, the recommendation for surgery, and alternatives were discussed with her and her accompanying family members.  All questions were answered to their satisfaction.  A total of 60 minutes were spent with the patient/family today; 50% was spent in education, counseling and coordination of care for pelvic mass.  Verlon Au, NP  I personally interviewed and examined the patient. Agreed with the above/below plan of care. Patient/family questions were answered.  Mellody Drown, MD   CC:  Will Bonnet, MD [address] [place], [place] [address] [phone]

## 2017-06-30 NOTE — Progress Notes (Signed)
Pre and post operative teaching completed. Copy of education provided in AVS. Surgery will be scheduled for 4/17 with Dr. Theora Gianotti and Dr. Glennon Mac. PAT will be arranged. Provided my contact information for any future questions or needs Oncology Nurse Navigator Documentation  Navigator Location: [facility] (06/30/17 1600)   )Navigator Encounter Type: Initial GynOnc (06/30/17 1600)                     Patient Visit Type: GynOnc (06/30/17 1600)                              Time Spent with Patient: 60 (06/30/17 1600)

## 2017-06-30 NOTE — Patient Instructions (Addendum)
 Laparoscopy Laparoscopy is a procedure to diagnose diseases in the abdomen. During the procedure, a thin, lighted, pencil-sized instrument called a laparoscope is inserted into the abdomen through an incision. The laparoscope allows your health care provider to look at the organs inside your body. LET YOUR HEALTH CARE PROVIDER KNOW ABOUT:  Any allergies you have.  All medicines you are taking, including vitamins, herbs, eye drops, creams, and over-the-counter medicines.  Previous problems you or members of your family have had with the use of anesthetics.  Any blood disorders you have.  Previous surgeries you have had.  Medical conditions you have. RISKS AND COMPLICATIONS  Generally, this is a safe procedure. However, problems can occur, which may include:  Infection.  Bleeding.  Damage to other organs.  Allergic reaction to the anesthetics used during the procedure. BEFORE THE PROCEDURE  Do not eat or drink anything after midnight on the night before the procedure or as directed by your health care provider.  Ask your health care provider about: ? Changing or stopping your regular medicines. ? Taking medicines such as aspirin and ibuprofen. These medicines can thin your blood. Do not take these medicines before your procedure if your health care provider instructs you not to.  Plan to have someone take you home after the procedure. PROCEDURE  You may be given a medicine to help you relax (sedative).  You will be given a medicine to make you sleep (general anesthetic).  Your abdomen will be inflated with a gas. This will make your organs easier to see.  Small incisions will be made in your abdomen.  A laparoscope and other small instruments will be inserted into the abdomen through the incisions.  A tissue sample may be removed from an organ in the abdomen for examination.  The instruments will be removed from the abdomen.  The gas will be released.  The  incisions will be closed with stitches (sutures). AFTER THE PROCEDURE  Your blood pressure, heart rate, breathing rate, and blood oxygen level will be monitored often until the medicines you were given have worn off.   This information is not intended to replace advice given to you by your health care provider. Make sure you discuss any questions you have with your health care provider.               Clear Liquid Diet for GYN Oncology Patients Day Before Surgery The day before your scheduled surgery DO NOT EAT any solid foods.  We do want you to drink enough liquids, but NO MILK products.  We do not want you to be dehydrated.  Clear liquids are defined as no milk products and no pieces of any solid food. The following are all approved for you to drink the day before you surgery.  Chicken, Beef or Vegetable Broth (bouillon or consomm) - NO BROTH AFTER MIDNIGHT  Plain Jello  (no fruit)  Water  Strained lemonade or fruit punch  Gatorade (any flavor)  CLEAR Ensure or Boost Breeze  Fruit juices without pulp, such as apple, grape, or cranberry juice  Clear sodas - NO SODA AFTER MIDNIGHT  Ice Pops without bits of fruit or fruit pulp  Honey  Tea or coffee without milk or cream Any foods not on the above list should be avoided.                                                                                 DIVISION OF GYNECOLOGIC ONCOLOGY BOWEL PREP   The following instructions are extremely important to prepare for your surgery. Please follow them carefully   Step 1: Liquid Diet Instructions   The day before surgery, drink ONLY CLEAR LIQUIDS for breakfast, lunch, dinner and throughout the day.  Drink at least 64 oz of fluid.             CLEAR LIQUID EXAMPLES:             Beef, chicken or vegetable broth, sodas, coffee, tea (sugar, lemon             artificial sweeteners, honey are acceptable), juices (apple, grape, cranberry, any    mixture of clear juices). Kool-Aid,  Gatorade, Jell-o (without fruit), popsicles                          NO MILK, MILK PRODUCTS, NON-DAIRY CREAMERS    Step 2: Laxatives           The evening before surgery:   Time: around 5pm   Follow these instructions carefully.   Administer 1 Dulcolax suppository according to manufacturer instructions on the box. You will need to purchase this laxative at a pharmacy or grocery store.     Individual responses to laxatives vary; this prep may cause multiple bowel movements. It often works in 30 minutes and may take as long as 3 hours. Stay near an available bathroom.    It is important to stay hydrated. Ensure you are still drinking clear liquids.       IMPORTANT: FOR YOUR SAFETY, WE WILL HAVE TO CANCEL YOUR SURGERY IF YOU DO NOT FOLLOW THESE INSTRUCTIONS.    Do not eat anything after midnight (including gum or candy) prior to your surgery.  Avoid drinking carbonated beverages after midnight.  You can have clear liquids up until one hour before you arrive at the hospital. "Nothing by mouth" means no liquids, gum, candy, etc for one hour before your arrival time.                                              Bowel Symptoms After Surgery After gynecologic surgery, women often have temporary changes in bowel function (constipation and gas pain).  Following are tips to help prevent and treat common bowel problems.  It also tells you when to call the doctor.  This is important because some symptoms might be a sign of a more serious bowel problem such as obstruction (bowel blockage).  These problems are rare but can happen after gynecologic surgery.   Besides surgery, what can temporarily affect bowel function? 1. Dietary changes   2. Decreased physical activity   3.Antibiotics   4. Pain medication   How can I prevent constipation (three days or more without a stool)? 1. Include fiber in your diet: whole grains, raw or dried fruits & vegetables, prunes, prune/pear juiceDrink at least 8  glasses of liquid (preferably water) every day 2. Avoid: ? Gas forming foods such as broccoli, beans, peas, salads, cabbage, sweet potatoes ? Greasy, fatty, or fried foods 3. Activity helps bowel function return to normal, walk around the house at least 3-4 times each day for 15 minutes or longer, if tolerated.  Rocking in a rocking chair is preferable to sitting still. 4. Stool softeners: these are not laxatives,  but serve to soften the stool to avoid straining.  Take 2-4 times a day until normal bowel function returns         Examples: Colace or generic equivalent (Docusafe) 5. Bulk laxatives: provide a concentrated source of fiber.  They do not stimulate the bowel.  Take 1-2 times each day until normal bowel function return.              Examples: Citrucel, Metamucil, Fiberal, Fibercon   What can I take for "Gas Pains"? 1. Simethicone (Mylicon, Gas-X, Maalox-Gas, Mylanta-Gas) take 3-4 times a day 2. Maalox Regular - take 3-4 times a day 3. Mylanta Regular - take 3-4 times a day   What can I take if I become constipated? 1. Start with stool softeners and add additional laxatives below as needed to have a bowel movement every 1-2 days  2. Stool softeners 1-2 tablets, 2 times a day 3. Senakot 1-2 tablets, 1-2 times a day 4. Glycerin suppository can soften hard stool take once a day 5. Bisacodyl suppository once a day  6. Milk of Magnesia 30 mL 1-2 times a day 7. Fleets or tap water enema    What can I do for nausea?  1. Limit most solid foods for 24-48 hours 2. Continue eating small frequent amounts of liquids and/or bland soft foods ? Toast, crackers, cooked cereal (grits, cream of wheat, rice) 3. Benadryl: a mild anti-nausea medicine can be obtained without a prescription. May cause drowsiness, especially if taken with narcotic pain medicines 4. Contact provider for prescription nausea medication     What can I do, or take for diarrhea (more than five loose stools per day)? 1. Drink  plenty of clear fluids to prevent dehydration 2. May take Kaopectate, Pepto-Bismol, Immodium, or probiotics for 1-2 days 3. Annusol or Preparation-H can be helpful for hemorrhoids and irritated tissue around anus   When should I call the doctor?             CONSTIPATION:   Not relieved after three days following the above program VOMITING:  That contains blood, "coffee ground" material  More the three times/hour and unable to keep down nausea medication for more than eight hours  With dry mouth, dark or strong urine, feeling light-headed, dizzy, or confused  With severe abdominal pain or bloating for more than 24 hours DIARRHEA:  That continues for more then 24-48 hours despite treatment  That contains blood or tarry material  With dry mouth, dark or strong urine, feeling light~headed, dizzy, or confused FEVER:  101 F or higher along with nausea, vomiting, gas pain, diarrhea UNABLE TO:  Pass gas from rectum for more than 24 hours  Tolerate liquids by mouth for more than 24 hours                   Laparoscopy, Care After Refer to this sheet in the next few weeks. These instructions provide you with information about caring for yourself after your procedure. Your health care provider may also give you more specific instructions. Your treatment has been planned according to current medical practices, but problems sometimes occur. Call your health care provider if you have any problems or questions after your procedure. WHAT TO EXPECT AFTER THE PROCEDURE After your procedure, it is common to have mild discomfort in the throat and abdomen. HOME CARE INSTRUCTIONS  Take over-the-counter and prescription medicines only as told by your health care provider.  Do not drive for 24 hours if you  received a sedative.  Return to your normal activities as told by your health care provider.  Do not take baths, swim, or use a hot tub until your health care provider approves. You may  shower.  Follow instructions from your health care provider about how to take care of your incision. Make sure you: ? Wash your hands with soap and water before you change your bandage (dressing). If soap and water are not available, use hand sanitizer. ? Change your dressing as told by your health care provider. ? Leave stitches (sutures), skin glue, or adhesive strips in place. These skin closures may need to stay in place for 2 weeks or longer. If adhesive strip edges start to loosen and curl up, you may trim the loose edges. Do not remove adhesive strips completely unless your health care provider tells you to do that.  Check your incision area every day for signs of infection. Check for: ? More redness, swelling, or pain. ? More fluid or blood. ? Warmth. ? Pus or a bad smell.  It is your responsibility to get the results of your procedure. Ask your health care provider or the department performing the procedure when your results will be ready. SEEK MEDICAL CARE IF:  There is new pain in your shoulders.  You feel light-headed or faint.  You are unable to pass gas or unable to have a bowel movement.  You feel nauseous or you vomit.  You develop a rash.  You have more redness, swelling, or pain around your incision.  You have more fluid or blood coming from your incision.  Your incision feels warm to the touch.  You have pus or a bad smell coming from your incision.  You have a fever or chills. SEEK IMMEDIATE MEDICAL CARE IF:  Your pain is getting worse.  You have ongoing vomiting.  The edges of your incision open up.  You have trouble breathing.  You have chest pain.   This information is not intended to replace advice given to you by your health care provider. Make sure you discuss any questions you have with your health care provider.    Laparoscopic Hysterectomy, Care After Refer to this sheet in the next few weeks. These instructions provide you with  information on caring for yourself after your procedure. Your health care provider may also give you more specific instructions. Your treatment has been planned according to current medical practices, but problems sometimes occur. Call your health care provider if you have any problems or questions after your procedure. What can I expect after the procedure?  Pain and bruising at the incision sites. You will be given pain medicine to control it.  Menopausal symptoms such as hot flashes, night sweats, and insomnia if your ovaries were removed.  Sore throat from the breathing tube that was inserted during surgery. Follow these instructions at home:  Only take over-the-counter or prescription medicines for pain, discomfort, or fever as directed by your health care provider.  Do not take aspirin. It can cause bleeding.  Do not drive when taking pain medicine.  Follow your health care provider's advice regarding diet, exercise, lifting, driving, and general activities.  Resume your usual diet as directed and allowed.  Get plenty of rest and sleep.  Do not douche, use tampons, or have sexual intercourse for at least 6 weeks, or until your health care provider gives you permission.  Change your bandages (dressings) as directed by your health care provider.  Monitor your  temperature and notify your health care provider of a fever.  Take showers instead of baths for 2-3 weeks.  Do not drink alcohol until your health care provider gives you permission.  If you develop constipation, you may take a mild laxative with your health care provider's permission. Bran foods may help with constipation problems. Drinking enough fluids to keep your urine clear or pale yellow may help as well.  Try to have someone home with you for 1-2 weeks to help around the house.  Keep all of your follow-up appointments as directed by your health care provider. Contact a health care provider if:  You have  swelling, redness, or increasing pain around your incision sites.  You have pus coming from your incision.  You notice a bad smell coming from your incision.  Your incision breaks open.  You feel dizzy or lightheaded.  You have pain or bleeding when you urinate.  You have persistent diarrhea.  You have persistent nausea and vomiting.  You have abnormal vaginal discharge.  You have a rash.  You have any type of abnormal reaction or develop an allergy to your medicine.  You have poor pain control with your prescribed medicine. Get help right away if:  You have chest pain or shortness of breath.  You have severe abdominal pain that is not relieved with pain medicine.  You have pain or swelling in your legs. This information is not intended to replace advice given to you by your health care provider. Make sure you discuss any questions you have with your health care provider. Document Released: 12/28/2012 Document Revised: 08/15/2015 Document Reviewed: 09/27/2012 Elsevier Interactive Patient Education  2017 [employer].                      I have sent a prescription for pain medication to the [facility]. As we discussed, you should take Senna and Miralax to treat and prevent constipation related to opiate pain medication with goal of one bowel movement per day. Please let me know if you do not have results in next 24 hours. Thank you for allowing [place] to participate in your care. Ander Purpura, NP  Opioid Pain Medicine Information Opioids are powerful medicines that are used to treat moderate to severe pain. Opioids should be taken with the supervision of a trained health care provider. They should be taken for the shortest period of time as possible. This is because opioids can be addictive and the longer you take opioids, the greater your risk of addiction (opioid use disorder). What do opioids do? Opioids help to reduce or eliminate pain. When used for short  periods of time, they can help you:  Sleep better.  Do better in physical or occupational therapy.  Feel better in the first few days after an injury.  Recover from surgery.  What is a pain treatment plan? A pain treatment plan is an agreement between you and your health care provider. Pain is unique to each person, and treatments vary depending on your condition. To manage your pain successfully, you and your health care provider need to understand each other and work together. To help you do this:  Discuss the goals of your treatment, including how much pain you might expect to have and how you will manage the pain.  Review the risks and benefits of taking opioid medicines for your condition.  Remember that a good treatment plan uses more than one approach and minimizes the chance of  side effects.  Be honest about the amount of medicines you take, and about any drug or alcohol use.  Get pain medicine prescriptions from only one care provider.  Keep all follow-up visits as told by your health care provider. This is important.  What instructions should I follow while taking opioid pain medicine? While you are taking the medicine and for 8 hours after you stop taking the medicine, follow these instructions:  Do not drive.  Do not use machinery or power tools.  Do not sign legal documents.  Do not drink alcohol.  Do not take sleeping pills.  Do not supervise children by yourself.  Do not participate in activities that require climbing or being in high places.  Do not enter a body of water-such as a lake, river, ocean, spa, or swimming pool-unless an adult is nearby who can monitor and help you.  What kinds of side effects can opioids cause? Opioids can cause side effects, such as:  Constipation.  Nausea.  Vomiting.  Drowsiness.  Confusion.  Opioid use disorder.  Breathing difficulties (respiratory depression).  Using opioid pain medicines for longer than 3  days increases your risk of these side effects. Taking opioid pain medicine for a long period of time can affect your ability to do daily tasks. It also puts you at risk for:  Motor vehicle accidents.  Depression.  Suicide.  Heart attack.  Overdose, which can sometimes lead to death.  What are alternative ways to manage pain? Pain can be managed with many types of alternative treatments. Ask your health care provider to refer you to one or more specialists who can help you manage pain through:  Physical or occupational therapy.  Counseling (cognitive behavioral therapy).  Good nutrition.  Biofeedback.  Massage.  Meditation.  Non-opioid medicine.  Following a gentle exercise program.  How can I keep others safe while I am taking opioid pain medicine?  Keep pain medicine in a locked cabinet, or in a secure area where children cannot reach it.  Never share your pain medicine with anyone.  Do not save any leftover pills. If you have leftover medicine, you can: 1. Bring the medicine to a prescription take-back program. This is usually offered by the county or Event organiser. 2. Throw it out in the trash. To do this:  Mix the medicine with undesirable trash such as pet waste or food.  Put the mixture in a sealed container or plastic bag.  Throw it in the trash.  Destroy any personal information on the prescription bottle. How do I stop taking opioids if I have been taking them for a long time? If you have been taking opioid medicine for more than a few weeks, you may need to slowly decrease (taper) how much you take until you stop completely. Tapering your use of opioids can decrease your chances of experiencing withdrawal symptoms, such as:  Pain and cramping in the abdomen.  Nausea.  Sweating.  Sleepiness.  Restlessness.  Uncontrollable shaking (tremors).  Cravings for the medicine.  Do not attempt to taper your use of opioids on your own. Talk with your  health care provider about how to do this. Your health care provider may prescribe a step-down schedule based on how much medicine you are taking and how long you have been taking it. Where to find support: If you have been taking opioids for a long time, you may benefit from receiving support for quitting from a local support group or counselor. Ask your  health care provider for a referral to these resources in your area. Where to find more information:  Centers for Disease Control and Prevention (CDC): [employer] Get help right away if: Seek medical care right away if you are taking opioids and you (or people close to you) notice any of the following:  Difficulty breathing.  Breathing that is slower or more shallow than normal.  A very slow heartbeat (pulse).  Severe confusion.  Unconsciousness.  Sleepiness.  Slurred speech.  Nausea and vomiting.  Cold, clammy skin.  Blue lips or fingernails.  Limpness.  Abnormally small pupils.  If you think that you or someone else may have taken too much of an opioid medicine, get medical help right away. Do not wait to see if the symptoms go away on their own.  If you ever feel like you may hurt yourself or others, or have thoughts about taking your own life, get help right away. You can go to your nearest emergency department or call:  Your local emergency services (911 in the [place]  The hotline of the [facility] 941-113-4439 in the [place]  A suicide crisis helpline, such as the [place] at (989) 185-9598. This is open 24 hours a day.  Summary  Opioid medicines can help you manage moderate to severe pain for a short period of time.  Discuss the goals of your treatment with your health care provider, including how much pain you might expect to have and how you will manage the pain.  A good treatment plan uses more than one approach. Pain can be  managed with many types of alternative treatments.  If you think that you or someone else may have taken too much of an opioid, get medical help right away. This information is not intended to replace advice given to you by your health care provider. Make sure you discuss any questions you have with your health care provider. Document Released: 04/05/2015 Document Revised: 06/26/2016 Document Reviewed: 10/19/2014 Elsevier Interactive Patient Education  2018 [employer].  Constipation, Adult Constipation is when a person has fewer bowel movements in a week than normal, has difficulty having a bowel movement, or has stools that are dry, hard, or larger than normal. Constipation may be caused by an underlying condition. It may become worse with age if a person takes certain medicines and does not take in enough fluids. Follow these instructions at home: Eating and drinking   Eat foods that have a lot of fiber, such as fresh fruits and vegetables, whole grains, and beans.  Limit foods that are high in fat, low in fiber, or overly processed, such as french fries, hamburgers, cookies, candies, and soda.  Drink enough fluid to keep your urine clear or pale yellow. General instructions  Exercise regularly or as told by your health care provider.  Go to the restroom when you have the urge to go. Do not hold it in.  Take over-the-counter and prescription medicines only as told by your health care provider. These include any fiber supplements.  Practice pelvic floor retraining exercises, such as deep breathing while relaxing the lower abdomen and pelvic floor relaxation during bowel movements.  Watch your condition for any changes.  Keep all follow-up visits as told by your health care provider. This is important. Contact a health care provider if:  You have pain that gets worse.  You have a fever.  You do not have a bowel movement after 4 days.  You vomit.  You are not hungry.  You  lose weight.  You are bleeding from the anus.  You have thin, pencil-like stools. Get help right away if:  You have a fever and your symptoms suddenly get worse.  You leak stool or have blood in your stool.  Your abdomen is bloated.  You have severe pain in your abdomen.  You feel dizzy or you faint. This information is not intended to replace advice given to you by your health care provider. Make sure you discuss any questions you have with your health care provider. Document Released: 12/06/2003 Document Revised: 09/27/2015 Document Reviewed: 08/28/2015 Elsevier Interactive Patient Education  2018 [employer].

## 2017-07-01 ENCOUNTER — Telehealth: Payer: Self-pay

## 2017-07-01 NOTE — Telephone Encounter (Signed)
Surgery has been scheduled for 07/07/17 with Dr. Theora Gianotti and Dr. Glennon Mac. Surgery booking request faxed to OR scheduling with confirmation of receipt. Copy of consent faxed to PAT with confirmation of receipt. Original consent sent to medical records for filing. Notified Ms. Dolliver of PAT appt 4/12 (phone) between 9-1P.

## 2017-07-02 ENCOUNTER — Encounter
Admission: RE | Admit: 2017-07-02 | Discharge: 2017-07-02 | Disposition: A | Discharge status: Home / Self Care | Payer: 59 | Source: Ambulatory Visit | Attending: Obstetrics and Gynecology | Admitting: Obstetrics and Gynecology

## 2017-07-02 ENCOUNTER — Other Ambulatory Visit: Payer: Self-pay

## 2017-07-02 HISTORY — DX: Headache, unspecified: R51.9

## 2017-07-02 HISTORY — DX: Intra-abdominal and pelvic swelling, mass and lump, unspecified site: R19.00

## 2017-07-02 HISTORY — DX: Unspecified osteoarthritis, unspecified site: M19.90

## 2017-07-02 HISTORY — DX: Headache: R51

## 2017-07-02 NOTE — Patient Instructions (Signed)
Your procedure is scheduled on: 07-07-17 [facility] Report to Same Day Surgery 2nd floor medical mall [facility] Entrance-take elevator on left to 2nd floor.  Check in with surgery information desk.) To find out your arrival time please call [phone] between 1PM - 3PM on 07-06-17 TUESDAY  Remember: Instructions that are not followed completely may result in serious medical risk, up to and including death, or upon the discretion of your surgeon and anesthesiologist your surgery may need to be rescheduled.    _x___ 1. Do not eat food after midnight the night before your procedure. NO GUM OR CANDY AFTER MIDNIGHT.  You may drink clear liquids up to 2 hours before you are scheduled to arrive at the hospital for your procedure.  Do not drink clear liquids within 2 hours of your scheduled arrival to the hospital.  Clear liquids include  --Water or Apple juice without pulp  --Clear carbohydrate beverage such as ClearFast or Gatorade  --Black Coffee or Clear Tea (No milk, no creamers, do not add anything to the coffee or Tea     __x__ 2. No Alcohol for 24 hours before or after surgery.   __x__3. No Smoking or e-cigarettes for 24 prior to surgery.  Do not use any chewable tobacco products for at least 6 hour prior to surgery   ____  4. Bring all medications with you on the day of surgery if instructed.    __x__ 5. Notify your doctor if there is any change in your medical condition     (cold, fever, infections).    x___6. On the morning of surgery brush your teeth with toothpaste and water.  You may rinse your mouth with mouth wash if you wish.  Do not swallow any toothpaste or mouthwash.   Do not wear jewelry, make-up, hairpins, clips or nail polish.  Do not wear lotions, powders, or perfumes. You may wear deodorant.  Do not shave 48 hours prior to surgery. Men may shave face and neck.  Do not bring valuables to the hospital.    [facility] is not responsible for any belongings or  valuables.               Contacts, dentures or bridgework may not be worn into surgery.  Leave your suitcase in the car. After surgery it may be brought to your room.  For patients admitted to the hospital, discharge time is determined by your treatment team.  _  Patients discharged the day of surgery will not be allowed to drive home.  You will need someone to drive you home and stay with you the night of your procedure.    Please read over the following fact sheets that you were given:   [facility] Preparing for Surgery and or MRSA Information   ____ Take anti-hypertensive listed below, cardiac, seizure, asthma, anti-reflux and psychiatric medicines. These include:  1. YOU MAY TAKE PERCOCET DAY OF SURGERY IF NEEDED WITH A SMALL SIP OF WATER  2.  3.  4.  5.  6.  ____Fleets enema or Magnesium Citrate as directed.   _x___ Use CHG Soap or sage wipes as directed on instruction sheet   ____ Use inhalers on the day of surgery and bring to hospital day of surgery  ____ Stop Metformin and Janumet 2 days prior to surgery.    ____ Take 1/2 of usual insulin dose the night before surgery and none on the morning surgery.   _x___ Follow recommendations from Cardiologist, Pulmonologist or  PCP regarding stopping Aspirin, Coumadin, Plavix ,Eliquis, Effient, or Pradaxa, and Pletal-STOP ASPIRIN NOW  X____Stop Anti-inflammatories such as Advil, Aleve, Ibuprofen, Motrin, Naproxen, Naprosyn, Goodies powders or aspirin products NOW-OK to take Tylenol OR PERCOCET IF NEEDED   ____ Stop supplements until after surgery.     ____ Bring C-Pap to the hospital.

## 2017-07-05 ENCOUNTER — Encounter
Admission: RE | Admit: 2017-07-05 | Discharge: 2017-07-05 | Disposition: A | Discharge status: Home / Self Care | Payer: 59 | Source: Ambulatory Visit | Attending: Obstetrics and Gynecology | Admitting: Obstetrics and Gynecology

## 2017-07-05 DIAGNOSIS — D270 Benign neoplasm of right ovary: Secondary | ICD-10-CM | POA: Diagnosis not present

## 2017-07-05 DIAGNOSIS — Z79899 Other long term (current) drug therapy: Secondary | ICD-10-CM | POA: Diagnosis not present

## 2017-07-05 DIAGNOSIS — D251 Intramural leiomyoma of uterus: Secondary | ICD-10-CM | POA: Diagnosis not present

## 2017-07-05 DIAGNOSIS — F17200 Nicotine dependence, unspecified, uncomplicated: Secondary | ICD-10-CM | POA: Diagnosis not present

## 2017-07-05 DIAGNOSIS — R6881 Early satiety: Secondary | ICD-10-CM | POA: Diagnosis not present

## 2017-07-05 DIAGNOSIS — N838 Other noninflammatory disorders of ovary, fallopian tube and broad ligament: Secondary | ICD-10-CM | POA: Diagnosis not present

## 2017-07-05 DIAGNOSIS — N840 Polyp of corpus uteri: Secondary | ICD-10-CM | POA: Diagnosis not present

## 2017-07-05 DIAGNOSIS — R1031 Right lower quadrant pain: Secondary | ICD-10-CM | POA: Diagnosis not present

## 2017-07-05 DIAGNOSIS — D252 Subserosal leiomyoma of uterus: Secondary | ICD-10-CM | POA: Diagnosis not present

## 2017-07-05 LAB — COMPREHENSIVE METABOLIC PANEL
ALT: 20 U/L (ref 14–54)
ANION GAP: 6 (ref 5–15)
AST: 26 U/L (ref 15–41)
Albumin: 3.8 g/dL (ref 3.5–5.0)
Alkaline Phosphatase: 88 U/L (ref 38–126)
BUN: 14 mg/dL (ref 6–20)
CHLORIDE: 103 mmol/L (ref 101–111)
CO2: 28 mmol/L (ref 22–32)
Calcium: 9 mg/dL (ref 8.9–10.3)
Creatinine, Ser: 0.71 mg/dL (ref 0.44–1.00)
Glucose, Bld: 79 mg/dL (ref 65–99)
POTASSIUM: 3.8 mmol/L (ref 3.5–5.1)
SODIUM: 137 mmol/L (ref 135–145)
Total Bilirubin: 0.3 mg/dL (ref 0.3–1.2)
Total Protein: 6.9 g/dL (ref 6.5–8.1)

## 2017-07-05 LAB — TYPE AND SCREEN
ABO/RH(D): B POS
Antibody Screen: NEGATIVE

## 2017-07-05 LAB — PROTIME-INR
INR: 0.93
PROTHROMBIN TIME: 12.4 s (ref 11.4–15.2)

## 2017-07-05 LAB — CBC WITH DIFFERENTIAL/PLATELET
Basophils Absolute: 0 10*3/uL (ref 0–0.1)
Basophils Relative: 1 %
EOS ABS: 0.1 10*3/uL (ref 0–0.7)
EOS PCT: 2 %
HCT: 40.2 % (ref 35.0–47.0)
Hemoglobin: 13.5 g/dL (ref 12.0–16.0)
LYMPHS ABS: 1.7 10*3/uL (ref 1.0–3.6)
Lymphocytes Relative: 38 %
MCH: 31.2 pg (ref 26.0–34.0)
MCHC: 33.6 g/dL (ref 32.0–36.0)
MCV: 93 fL (ref 80.0–100.0)
MONO ABS: 0.3 10*3/uL (ref 0.2–0.9)
MONOS PCT: 8 %
Neutro Abs: 2.3 10*3/uL (ref 1.4–6.5)
Neutrophils Relative %: 51 %
PLATELETS: 145 10*3/uL — AB (ref 150–440)
RBC: 4.33 MIL/uL (ref 3.80–5.20)
RDW: 13.6 % (ref 11.5–14.5)
WBC: 4.5 10*3/uL (ref 3.6–11.0)

## 2017-07-05 LAB — URINALYSIS, ROUTINE W REFLEX MICROSCOPIC
Bilirubin Urine: NEGATIVE
GLUCOSE, UA: NEGATIVE mg/dL
HGB URINE DIPSTICK: NEGATIVE
KETONES UR: NEGATIVE mg/dL
Leukocytes, UA: NEGATIVE
Nitrite: NEGATIVE
PH: 5 (ref 5.0–8.0)
PROTEIN: NEGATIVE mg/dL
Specific Gravity, Urine: 1.015 (ref 1.005–1.030)

## 2017-07-05 LAB — APTT: aPTT: 31 seconds (ref 24–36)

## 2017-07-06 MED ORDER — CEFAZOLIN SODIUM-DEXTROSE 2-4 GM/100ML-% IV SOLN
2.0000 g | INTRAVENOUS | Status: AC
  Administered 2017-07-07: 2 g via INTRAVENOUS
  Filled 2017-07-06: qty 100

## 2017-07-07 ENCOUNTER — Encounter
Admission: RE | Disposition: A | Discharge status: Home / Self Care | Payer: Self-pay | Source: Ambulatory Visit | Attending: Obstetrics and Gynecology

## 2017-07-07 ENCOUNTER — Other Ambulatory Visit: Payer: Self-pay

## 2017-07-07 ENCOUNTER — Ambulatory Visit
Admission: RE | Admit: 2017-07-07 | Discharge: 2017-07-07 | Disposition: A | Discharge status: Home / Self Care | Payer: 59 | Source: Ambulatory Visit | Attending: Obstetrics and Gynecology | Admitting: Obstetrics and Gynecology

## 2017-07-07 ENCOUNTER — Ambulatory Visit: Payer: 59 | Admitting: Anesthesiology

## 2017-07-07 ENCOUNTER — Encounter: Payer: Self-pay | Admitting: *Deleted

## 2017-07-07 DIAGNOSIS — N838 Other noninflammatory disorders of ovary, fallopian tube and broad ligament: Secondary | ICD-10-CM

## 2017-07-07 DIAGNOSIS — D270 Benign neoplasm of right ovary: Secondary | ICD-10-CM | POA: Diagnosis not present

## 2017-07-07 DIAGNOSIS — R1031 Right lower quadrant pain: Secondary | ICD-10-CM | POA: Insufficient documentation

## 2017-07-07 DIAGNOSIS — R1900 Intra-abdominal and pelvic swelling, mass and lump, unspecified site: Secondary | ICD-10-CM | POA: Diagnosis not present

## 2017-07-07 DIAGNOSIS — R6881 Early satiety: Secondary | ICD-10-CM | POA: Insufficient documentation

## 2017-07-07 DIAGNOSIS — Z9079 Acquired absence of other genital organ(s): Secondary | ICD-10-CM

## 2017-07-07 DIAGNOSIS — D251 Intramural leiomyoma of uterus: Secondary | ICD-10-CM | POA: Insufficient documentation

## 2017-07-07 DIAGNOSIS — Z90710 Acquired absence of both cervix and uterus: Secondary | ICD-10-CM

## 2017-07-07 DIAGNOSIS — D252 Subserosal leiomyoma of uterus: Secondary | ICD-10-CM | POA: Insufficient documentation

## 2017-07-07 DIAGNOSIS — N840 Polyp of corpus uteri: Secondary | ICD-10-CM

## 2017-07-07 DIAGNOSIS — Z79899 Other long term (current) drug therapy: Secondary | ICD-10-CM | POA: Insufficient documentation

## 2017-07-07 DIAGNOSIS — Z90722 Acquired absence of ovaries, bilateral: Secondary | ICD-10-CM

## 2017-07-07 DIAGNOSIS — F17200 Nicotine dependence, unspecified, uncomplicated: Secondary | ICD-10-CM | POA: Insufficient documentation

## 2017-07-07 HISTORY — PX: ROBOTIC ASSISTED BILATERAL SALPINGO OOPHERECTOMY: SHX6078

## 2017-07-07 HISTORY — PX: LAPAROSCOPIC APPENDECTOMY: SHX408

## 2017-07-07 HISTORY — PX: ROBOTIC ASSISTED TOTAL HYSTERECTOMY: SHX6085

## 2017-07-07 LAB — ABO/RH: ABO/RH(D): B POS

## 2017-07-07 SURGERY — ROBOTIC ASSISTED TOTAL HYSTERECTOMY
Anesthesia: General

## 2017-07-07 MED ORDER — PHENYLEPHRINE HCL 10 MG/ML IJ SOLN
INTRAMUSCULAR | Status: DC | PRN
  Administered 2017-07-07: 50 ug via INTRAVENOUS

## 2017-07-07 MED ORDER — PROPOFOL 10 MG/ML IV BOLUS
INTRAVENOUS | Status: AC
  Filled 2017-07-07: qty 20

## 2017-07-07 MED ORDER — DEXAMETHASONE SODIUM PHOSPHATE 10 MG/ML IJ SOLN
INTRAMUSCULAR | Status: DC | PRN
  Administered 2017-07-07: 8 mg via INTRAVENOUS

## 2017-07-07 MED ORDER — CHLORHEXIDINE GLUCONATE CLOTH 2 % EX PADS: 6.0000 | MEDICATED_PAD | Freq: Once | CUTANEOUS | Status: DC

## 2017-07-07 MED ORDER — FENTANYL CITRATE (PF) 100 MCG/2ML IJ SOLN
INTRAMUSCULAR | Status: AC
  Filled 2017-07-07: qty 2

## 2017-07-07 MED ORDER — POLYETHYLENE GLYCOL 3350 17 G PO PACK: 17.0000 g | PACK | Freq: Every day | ORAL | 0 refills | Status: DC | PRN

## 2017-07-07 MED ORDER — ROCURONIUM BROMIDE 50 MG/5ML IV SOLN
INTRAVENOUS | Status: AC
  Filled 2017-07-07: qty 1

## 2017-07-07 MED ORDER — ACETAMINOPHEN 500 MG PO TABS
1000.0000 mg | ORAL_TABLET | ORAL | Status: AC
  Administered 2017-07-07: 1000 mg via ORAL

## 2017-07-07 MED ORDER — ONDANSETRON HCL 4 MG/2ML IJ SOLN
INTRAMUSCULAR | Status: AC
  Filled 2017-07-07: qty 2

## 2017-07-07 MED ORDER — SUCCINYLCHOLINE CHLORIDE 20 MG/ML IJ SOLN
INTRAMUSCULAR | Status: AC
  Filled 2017-07-07: qty 1

## 2017-07-07 MED ORDER — MIDAZOLAM HCL 2 MG/2ML IJ SOLN
INTRAMUSCULAR | Status: AC
  Filled 2017-07-07: qty 2

## 2017-07-07 MED ORDER — SUCCINYLCHOLINE CHLORIDE 20 MG/ML IJ SOLN
INTRAMUSCULAR | Status: DC | PRN
  Administered 2017-07-07: 80 mg via INTRAVENOUS

## 2017-07-07 MED ORDER — OXYCODONE-ACETAMINOPHEN 5-325 MG PO TABS
2.0000 | ORAL_TABLET | Freq: Four times a day (QID) | ORAL | Status: DC | PRN
  Administered 2017-07-07: 2 via ORAL

## 2017-07-07 MED ORDER — BUPIVACAINE HCL (PF) 0.5 % IJ SOLN
INTRAMUSCULAR | Status: AC
  Filled 2017-07-07: qty 30

## 2017-07-07 MED ORDER — FENTANYL CITRATE (PF) 100 MCG/2ML IJ SOLN
INTRAMUSCULAR | Status: DC | PRN
  Administered 2017-07-07 (×2): 50 ug via INTRAVENOUS
  Administered 2017-07-07: 100 ug via INTRAVENOUS
  Administered 2017-07-07 (×2): 50 ug via INTRAVENOUS

## 2017-07-07 MED ORDER — FAMOTIDINE 20 MG PO TABS
ORAL_TABLET | ORAL | Status: AC
  Filled 2017-07-07: qty 1

## 2017-07-07 MED ORDER — FENTANYL CITRATE (PF) 100 MCG/2ML IJ SOLN: 25.0000 ug | INTRAMUSCULAR | Status: DC | PRN

## 2017-07-07 MED ORDER — LACTATED RINGERS IV SOLN
INTRAVENOUS | Status: DC
  Administered 2017-07-07: 07:00:00 via INTRAVENOUS

## 2017-07-07 MED ORDER — LIDOCAINE HCL (PF) 2 % IJ SOLN
INTRAMUSCULAR | Status: AC
  Filled 2017-07-07: qty 10

## 2017-07-07 MED ORDER — SODIUM CHLORIDE 0.9 % IJ SOLN
INTRAMUSCULAR | Status: AC
  Filled 2017-07-07: qty 10

## 2017-07-07 MED ORDER — ONDANSETRON HCL 4 MG/2ML IJ SOLN: 4.0000 mg | Freq: Once | INTRAMUSCULAR | Status: DC | PRN

## 2017-07-07 MED ORDER — OXYCODONE-ACETAMINOPHEN 5-325 MG PO TABS
ORAL_TABLET | ORAL | Status: AC
  Filled 2017-07-07: qty 2

## 2017-07-07 MED ORDER — CEFAZOLIN SODIUM-DEXTROSE 2-3 GM-%(50ML) IV SOLR
INTRAVENOUS | Status: AC
  Filled 2017-07-07: qty 50

## 2017-07-07 MED ORDER — IBUPROFEN 600 MG PO TABS: 600.0000 mg | ORAL_TABLET | Freq: Four times a day (QID) | ORAL | 1 refills | Status: DC

## 2017-07-07 MED ORDER — ACETAMINOPHEN 500 MG PO TABS
ORAL_TABLET | ORAL | Status: AC
  Filled 2017-07-07: qty 2

## 2017-07-07 MED ORDER — PROPOFOL 10 MG/ML IV BOLUS
INTRAVENOUS | Status: DC | PRN
  Administered 2017-07-07: 110 mg via INTRAVENOUS

## 2017-07-07 MED ORDER — ROCURONIUM BROMIDE 100 MG/10ML IV SOLN
INTRAVENOUS | Status: DC | PRN
  Administered 2017-07-07: 45 mg via INTRAVENOUS
  Administered 2017-07-07: 5 mg via INTRAVENOUS

## 2017-07-07 MED ORDER — ONDANSETRON 8 MG PO TBDP: 8.0000 mg | ORAL_TABLET | Freq: Three times a day (TID) | ORAL | 0 refills | Status: DC | PRN

## 2017-07-07 MED ORDER — KETOROLAC TROMETHAMINE 30 MG/ML IJ SOLN
INTRAMUSCULAR | Status: DC | PRN
  Administered 2017-07-07: 30 mg via INTRAVENOUS

## 2017-07-07 MED ORDER — FENTANYL CITRATE (PF) 250 MCG/5ML IJ SOLN
INTRAMUSCULAR | Status: AC
  Filled 2017-07-07: qty 5

## 2017-07-07 MED ORDER — DEXAMETHASONE SODIUM PHOSPHATE 10 MG/ML IJ SOLN
INTRAMUSCULAR | Status: AC
  Filled 2017-07-07: qty 1

## 2017-07-07 MED ORDER — ONDANSETRON HCL 4 MG/2ML IJ SOLN
INTRAMUSCULAR | Status: DC | PRN
  Administered 2017-07-07: 4 mg via INTRAVENOUS

## 2017-07-07 MED ORDER — BUPIVACAINE HCL 0.5 % IJ SOLN
INTRAMUSCULAR | Status: DC | PRN
  Administered 2017-07-07: 12 mL

## 2017-07-07 MED ORDER — LIDOCAINE HCL (CARDIAC) 20 MG/ML IV SOLN
INTRAVENOUS | Status: DC | PRN
  Administered 2017-07-07: 60 mg via INTRAVENOUS

## 2017-07-07 MED ORDER — SUGAMMADEX SODIUM 200 MG/2ML IV SOLN
INTRAVENOUS | Status: DC | PRN
  Administered 2017-07-07: 120 mg via INTRAVENOUS

## 2017-07-07 MED ORDER — FAMOTIDINE 20 MG PO TABS
20.0000 mg | ORAL_TABLET | Freq: Once | ORAL | Status: AC
  Administered 2017-07-07: 20 mg via ORAL

## 2017-07-07 MED ORDER — OXYCODONE-ACETAMINOPHEN 5-325 MG PO TABS: 2.0000 | ORAL_TABLET | Freq: Four times a day (QID) | ORAL | 0 refills | Status: AC | PRN

## 2017-07-07 MED ORDER — MIDAZOLAM HCL 2 MG/2ML IJ SOLN
INTRAMUSCULAR | Status: DC | PRN
  Administered 2017-07-07: 2 mg via INTRAVENOUS

## 2017-07-07 MED ORDER — PHENYLEPHRINE HCL 10 MG/ML IJ SOLN
INTRAMUSCULAR | Status: AC
  Filled 2017-07-07: qty 1

## 2017-07-07 SURGICAL SUPPLY — 80 items
BAG URINE DRAINAGE (UROLOGICAL SUPPLIES) ×5 IMPLANT
BLADE SURG 11 STRL SS SAFETY (MISCELLANEOUS) ×5 IMPLANT
CANISTER SUCT 1200ML W/VALVE (MISCELLANEOUS) ×5 IMPLANT
CANNULA DILATOR 10 W/SLV (CANNULA) ×8 IMPLANT
CANNULA DILATOR 10MM W/SLV (CANNULA) ×2
CATH FOLEY 2WAY  5CC 16FR (CATHETERS) ×2
CATH URTH 16FR FL 2W BLN LF (CATHETERS) ×3 IMPLANT
CHLORAPREP W/TINT 26ML (MISCELLANEOUS) ×5 IMPLANT
CORD BIP STRL DISP 12FT (MISCELLANEOUS) ×5 IMPLANT
CORD MONOPOLAR M/FML 12FT (MISCELLANEOUS) ×5 IMPLANT
COVER TIP SHEARS 8 DVNC (MISCELLANEOUS) ×3 IMPLANT
COVER TIP SHEARS 8MM DA VINCI (MISCELLANEOUS) ×2
CUTTER FLEX LINEAR 45M (STAPLE) ×5 IMPLANT
DEFOGGER SCOPE WARMER CLEARIFY (MISCELLANEOUS) ×5 IMPLANT
DERMABOND ADVANCED (GAUZE/BANDAGES/DRESSINGS) ×2
DERMABOND ADVANCED .7 DNX12 (GAUZE/BANDAGES/DRESSINGS) ×3 IMPLANT
DRAPE LEGGINS SURG 28X43 STRL (DRAPES) ×5 IMPLANT
DRAPE SHEET LG 3/4 BI-LAMINATE (DRAPES) ×10 IMPLANT
DRESSING SURGICEL FIBRLLR 1X2 (HEMOSTASIS) ×6 IMPLANT
DRSG SURGICEL FIBRILLAR 1X2 (HEMOSTASIS) ×10
ELECT REM PT RETURN 9FT ADLT (ELECTROSURGICAL) ×5
ELECTRODE REM PT RTRN 9FT ADLT (ELECTROSURGICAL) ×3 IMPLANT
FILTER LAP SMOKE EVAC STRL (MISCELLANEOUS) ×5 IMPLANT
GLOVE BIO SURGEON STRL SZ 6.5 (GLOVE) ×32 IMPLANT
GLOVE BIO SURGEONS STRL SZ 6.5 (GLOVE) ×8
GLOVE INDICATOR 7.0 STRL GRN (GLOVE) ×40 IMPLANT
GOWN STRL REUS W/ TWL LRG LVL3 (GOWN DISPOSABLE) ×12 IMPLANT
GOWN STRL REUS W/TWL LRG LVL3 (GOWN DISPOSABLE) ×8
GRASPER SUT TROCAR 14GX15 (MISCELLANEOUS) ×5 IMPLANT
HANDLE YANKAUER SUCT BULB TIP (MISCELLANEOUS) ×5 IMPLANT
IRRIGATION STRYKERFLOW (MISCELLANEOUS) ×6 IMPLANT
IRRIGATOR STRYKERFLOW (MISCELLANEOUS) ×10
KIT ACCESSORY DA VINCI DISP (KITS) ×2
KIT ACCESSORY DVNC DISP (KITS) ×3 IMPLANT
KIT PINK PAD W/HEAD ARE REST (MISCELLANEOUS) ×5
KIT PINK PAD W/HEAD ARM REST (MISCELLANEOUS) ×3 IMPLANT
KIT TURNOVER CYSTO (KITS) ×5 IMPLANT
LABEL OR SOLS (LABEL) ×5 IMPLANT
MANIPULATOR VCARE LG CRV RETR (MISCELLANEOUS) IMPLANT
MANIPULATOR VCARE STD CRV RETR (MISCELLANEOUS) ×5 IMPLANT
NDL INSUFF 14G 150MM VS150000 (NEEDLE) ×5 IMPLANT
NDL INSUFF ACCESS 14 VERSASTEP (NEEDLE) ×5 IMPLANT
NEEDLE HYPO 22GX1.5 SAFETY (NEEDLE) ×10 IMPLANT
NS IRRIG 1000ML POUR BTL (IV SOLUTION) ×5 IMPLANT
NS IRRIG 500ML POUR BTL (IV SOLUTION) ×5 IMPLANT
OCCLUDER COLPOPNEUMO (BALLOONS) ×5 IMPLANT
PACK GYN LAPAROSCOPIC (MISCELLANEOUS) ×5 IMPLANT
PAD OB MATERNITY 4.3X12.25 (PERSONAL CARE ITEMS) ×5 IMPLANT
PAD PREP 24X41 OB/GYN DISP (PERSONAL CARE ITEMS) ×5 IMPLANT
PENCIL ELECTRO HAND CTR (MISCELLANEOUS) ×5 IMPLANT
POUCH ENDO CATCH II 15MM (MISCELLANEOUS) ×5 IMPLANT
POUCH SPECIMEN RETRIEVAL 10MM (ENDOMECHANICALS) ×5 IMPLANT
RELOAD STAPLE TA45 3.5 REG BLU (ENDOMECHANICALS) ×5 IMPLANT
RETRACTOR GRASP SM DA VINCI (INSTRUMENTS)
RETRACTOR GRASP SM DVNC (INSTRUMENTS) IMPLANT
SCISSORS METZENBAUM CVD 33 (INSTRUMENTS) ×5 IMPLANT
SLEEVE VERSASTEP EXPAND ONEST (MISCELLANEOUS) ×25 IMPLANT
SOLUTION ELECTROLUBE (MISCELLANEOUS) ×5 IMPLANT
SPONGE LAP 18X18 5 PK (GAUZE/BANDAGES/DRESSINGS) ×5 IMPLANT
SUT DVC VLOC 180 0 12IN GS21 (SUTURE) ×5
SUT MNCRL 4-0 (SUTURE) ×2
SUT MNCRL 4-0 27XMFL (SUTURE) ×3
SUT VIC AB 1 CT1 36 (SUTURE) ×5 IMPLANT
SUT VIC AB 2-0 CT1 27 (SUTURE) ×2
SUT VIC AB 2-0 CT1 TAPERPNT 27 (SUTURE) ×3 IMPLANT
SUT VICRYL 0 AB UR-6 (SUTURE) ×5 IMPLANT
SUTURE DVC VLC 180 0 12IN GS21 (SUTURE) ×3 IMPLANT
SUTURE MNCRL 4-0 27XMF (SUTURE) ×3 IMPLANT
SYR 10ML LL (SYRINGE) ×10 IMPLANT
SYR 3ML LL SCALE MARK (SYRINGE) ×5 IMPLANT
SYR 50ML LL SCALE MARK (SYRINGE) ×5 IMPLANT
SYR BULB IRRIG 60ML STRL (SYRINGE) ×5 IMPLANT
SYR CONTROL 10ML (SYRINGE) ×5 IMPLANT
TROCAR BALLN GELPORT 12X130M (ENDOMECHANICALS) ×5 IMPLANT
TROCAR DISP BLADELESS 8 DVNC (TROCAR) ×3 IMPLANT
TROCAR DISP BLADELESS 8MM (TROCAR) ×2
TROCAR VERSASTEP 12M LG PL (TROCAR) ×5 IMPLANT
TROCAR VERSASTEP PLUS 12MM (TROCAR) ×5 IMPLANT
TROCAR XCEL 12X100 BLDLESS (ENDOMECHANICALS) IMPLANT
TUBING INSUF HEATED (TUBING) ×5 IMPLANT

## 2017-07-07 NOTE — OR Nursing (Signed)
Ambulated down the hall and back.

## 2017-07-07 NOTE — OR Nursing (Signed)
Pt. arrivded in post-op c/o pain / burning in both eyes , Dr. Andree Elk notified , both eyes flused with saline.

## 2017-07-07 NOTE — OR Nursing (Signed)
Discussed discharge instructions with pt and partner. Both voice understanding.

## 2017-07-07 NOTE — Anesthesia Procedure Notes (Signed)

## 2017-07-07 NOTE — Anesthesia Preprocedure Evaluation (Signed)
Anesthesia Evaluation  Patient identified by MRN, date of birth, ID band Patient awake    Reviewed: Allergy & Precautions, H&P , NPO status , Patient's Chart, lab work & pertinent test results, reviewed documented beta blocker date and time   Airway Mallampati: II  TM Distance: >3 FB Neck ROM: full    Dental  (+) Teeth Intact   Pulmonary neg pulmonary ROS, Current Smoker,    Pulmonary exam normal        Cardiovascular negative cardio ROS Normal cardiovascular exam Rhythm:regular Rate:Normal     Neuro/Psych  Headaches, negative neurological ROS  negative psych ROS   GI/Hepatic negative GI ROS, Neg liver ROS,   Endo/Other  negative endocrine ROS  Renal/GU negative Renal ROS  negative genitourinary   Musculoskeletal   Abdominal   Peds  Hematology negative hematology ROS (+)   Anesthesia Other Findings Past Medical History: No date: Arthritis     Comment:  HANDS No date: Headache     Comment:  H/O MIGRAINES No date: Pelvic mass Past Surgical History: No date: WISDOM TOOTH EXTRACTION BMI    Body Mass Index:  22.14 kg/m     Reproductive/Obstetrics negative OB ROS                             Anesthesia Physical Anesthesia Plan  ASA: II  Anesthesia Plan: General ETT   Post-op Pain Management:    Induction:   PONV Risk Score and Plan: 2  Airway Management Planned:   Additional Equipment:   Intra-op Plan:   Post-operative Plan:   Informed Consent: I have reviewed the patients History and Physical, chart, labs and discussed the procedure including the risks, benefits and alternatives for the proposed anesthesia with the patient or authorized representative who has indicated his/her understanding and acceptance.   Dental Advisory Given  Plan Discussed with: CRNA  Anesthesia Plan Comments:         Anesthesia Quick Evaluation

## 2017-07-07 NOTE — Anesthesia Post-op Follow-up Note (Signed)
Anesthesia QCDR form completed.        

## 2017-07-07 NOTE — Op Note (Signed)
Operative Note   Date July 07, 2017   PRE-OP DIAGNOSIS: Pelvic mass    POST-OP DIAGNOSIS: Pelvic mass, mucinous neoplasm, pending final pathology  SURGEON: Surgeon(s) and Role: Panel 1:  Prentice Docker, MD  ASSISTANT:  Gillis Ends, MD  ANESTHESIA: Choice   PROCEDURE: Procedure(s): Robotic TLH, BSO, and appendectomy  ESTIMATED BLOOD LOSS: Minimal  DRAINS: Foley  TOTAL IV FLUIDS: 400 cc  SPECIMENS:  Uterus, cervix, bilateral ovaries and tubes, washings  COMPLICATIONS: None  DISPOSITION: Stable to PACU  CONDITION: Stable   INDICATIONS: Pelvic mass  FINDINGS: Exam under anesthesia revealed a 6 cm mobile anteverted uterus. There were no adnexal masses or nodularity. The parametria was smooth. The cervix was negative for gross lesions. Intraoperative findings included: The uterus was normal at 5-6 cm. The adnexa were significant for 12 cm right ovarian mass; normal tubes bilaterally, and normal left ovary. The upper abdomen was normal including omentum, bowel, liver, stomach, and diaphragmatic surfaces. There was no evidence of grossly malignant pelvic or right para-aortic lymph nodes. The appendix appeared full and the tip firm to palpation. GSU consulted.   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    PROCEDURE IN DETAIL: After informed consent was obtained, the patient was taken to the operating room where anesthesia was obtained without difficulty. The patient was positioned in the dorsal lithotomy position in [place] and her arms were carefully tucked at her sides and the usual precautions were taken.  She was prepped and draped in normal sterile fashion.  Time-out was performed and a Foley catheter was placed into the bladder and a standard VCare uterine manipulator was then placed in the uterus without incident.    A suprapubic incision as made and carried down through the various layers. The Hasson  port was placed and direct visualization confirmed peritoneal placement. The pelvis and abdomen were evaluated with the findings as noted above. Local anesthetic was used to infiltrate port sites and the underlying peritoneum. Two bilateral 8 mm robotic ports were placed with care to avoid the epigastric vessels bilaterally. A LUQ 5-12 mm assistant port was placed under visualization.   The patient was placed in Trendelenburg and the bowel was displaced up into the upper abdomen.  Cytologic washings were obtained. The robot was docked and instruments inserted under direct visualization.  Round ligaments were divided on each side with the EndoShears and the retroperitoneal space was opened bilaterally.  The ureters were identified and preserved and the infundibulopelvic ligaments were skeletonized, sealed and divided with the LigaSure device. The right utero-ovarian ligament was transected and the specimen placed in the upper abdomen. A bladder flap was created and the bladder was dissected down off the lower uterine segment and cervix using endoshears and electrocautery.  The uterine arteries were skeletonized bilaterally, sealed and divided with the LigaSure device.  A colpotomy was performed circumferentially along the V-Care ring with electrocautery and the cervix was incised from the vagina and the uterus, cervix, and left adnexal specimens were removed through the vagina. A 15 mm Endocatch bag was placed in the vagina and the right ovarian specimen placed into the bag. During this process the cystic wall was ruptured and a small amount clear fluid escaped. The bag and specimen were removed via the vagina. The cyst was drained with care to avoid further spillage and passed off the sterile field for intraoperative Frozen section.   A pneumo balloon was placed in the vagina and the vaginal  cuff was then closed in a running continuous fashion using the EndoStitch technique with 0 V-Lock suture with careful  attention to include the vaginal cuff angles and the vaginal mucosa within the closure.  The intraperitoneal pressure was dropped, and all planes of dissection, vascular pedicles and the vaginal cuff were found to be hemostatic except the left vaginal cuff. Hemostasis was obtained with further cauterization of the edge and fibrillar.    The appendectomy was performed in conjunction with Dr. Rosana Hoes. Please see his note for complete detail.   The suprapubic trocar was removed and the fascia was closed with 0 Vicryl suture using the Endoclose technique. The lateral trocars were removed under visualization.   Before the umbilical trocar was removed the CO2 gas was released.  The fascia there was closed with 0 Vicryl suture in interrupted technique.   The skin incision at the umbilicus was closed with subcuticular stitch.  The remaining skin incisions were closed with Indermil glue.  The patient tolerated the procedure well.  Sponge, lap and needle counts were correct x2.  The patient was taken to recovery room in excellent condition.  Antibiotics: Given 1st or 2nd generation cephalosporin, Antibiotics given within 1 hour of the start of the procedure, Antibiotics ordered to be discontinued within 24 hours post procedure.  VTE prophylaxis: was ordered perioperatively.   I assisted Dr. Glennon Mac with the procedure.  Gillis Ends, MD

## 2017-07-07 NOTE — Transfer of Care (Signed)
Immediate Anesthesia Transfer of Care Note  Patient: Tiffany Kelley  Procedure(s) Performed: ROBOTIC ASSISTED TOTAL HYSTERECTOMY (N/A ) ROBOTIC ASSISTED BILATERAL SALPINGO OOPHORECTOMY (Bilateral ) APPENDECTOMY LAPAROSCOPIC  Patient Location: PACU  Anesthesia Type:General  Level of Consciousness: awake and responds to stimulation  Airway & Oxygen Therapy: Patient Spontanous Breathing and Patient connected to face mask oxygen  Post-op Assessment: Report given to RN and Post -op Vital signs reviewed and stable  Post vital signs: Reviewed and stable  Last Vitals:  Vitals Value Taken Time  BP 140/68 07/07/2017 11:09 AM  Temp    Pulse 80 07/07/2017 11:10 AM  Resp 15 07/07/2017 11:10 AM  SpO2 100 % 07/07/2017 11:10 AM  Vitals shown include unvalidated device data.  Last Pain:  Vitals:   07/07/17 0613  TempSrc: Oral  PainSc: 0-No pain         Complications: No apparent anesthesia complications

## 2017-07-07 NOTE — Interval H&P Note (Signed)
History and Physical Interval Note:  07/07/2017 7:30 AM  Tiffany Kelley  has presented today for surgery, with the diagnosis of PELVIC MASS  The various methods of treatment have been discussed with the patient and family. After consideration of risks, benefits and other options for treatment, the patient has consented to  Procedure(s): ROBOTIC ASSISTED TOTAL HYSTERECTOMY (N/A) with possible staging as a surgical intervention .  The patient's history has been reviewed, patient examined, no change in status, stable for surgery.  I have reviewed the patient's chart and labs.  Questions were answered to the patient's satisfaction.  The consent and all surgical possibilities were reviewed with the patient and she agrees to proceed with the stated procedures as listed on the consent form.  Prentice Docker, MD, [facility], [facility] 07/07/2017 7:31 AM

## 2017-07-07 NOTE — Op Note (Addendum)
SURGICAL OPERATIVE REPORT  DATE OF PROCEDURE: 07/07/2017  ATTENDING Surgeon(s): Vickie Epley, MD  ASSISTANT(S):  Gillis Ends, MD Will Bonnet, MD  ANESTHESIA: GETA (General)  PRE-OPERATIVE DIAGNOSIS: Pelvic mass suspicious for mucinous neoplasm  POST-OPERATIVE DIAGNOSIS: Pelvic mass suspicious for mucinous neoplasm, rigid and thickened adjacent distal third of appendix  PROCEDURE(S):  1.) Laparoscopic robotic-assisted appendectomy (cpt: 44955)  INTRAOPERATIVE FINDINGS: Rigid and thickened adjacent distal third of appendix without surrounding inflammation or ascites  INTRAVENOUS FLUIDS: see primary team's operative report  ESTIMATED BLOOD LOSS: Minimal (<20 mL), see primary team's operative report  URINE OUTPUT: see primary team's operative report   SPECIMENS: Appendix  IMPLANTS: None  DRAINS: None  COMPLICATIONS: None apparent  CONDITION AT END OF PROCEDURE: Hemodynamically stable, remaining intubated for completion of gynecologic surgery  INDICATIONS FOR PROCEDURE:  Patient is a 57 y.o. female undergoing robotic-assisted TLH and BSO by gynecologic oncology and [facility] gynecology when patient was noted to have a rigid and thickened adjacent appendiceal distal third without any surrounding inflammation or ascites, for which stat intra-operative general surgery consult was requested. Considering abnormal-appearing distal third of appendix in the context of mucinous neoplasm, decision was made to proceed with laparoscopic robotic-assisted appendectomy.  DETAILS OF PROCEDURE: Blunt graspers were gently used to retract the bowel overlying the patient's appendix with clear visualization of the rigid and thickened distal third adjacent ovarian mucinous neoplasm without appreciable peri-appendiceal inflammation or ascites. The appendix was gently retracted, and the base of the appendix and mesoappendix were identified in relation to the cecum. The mesoappendix  was dissected from the visceral appendix using robotic bipolar electrocautery, and hemostasis was achieved. Upon freeing the visceral appendix from the mesoappendix, an endostapler loaded with a standard blue tissue load was advanced across the base of the visceral appendix, which was compressed for several seconds, and the stapler was deployed and removed from the abdominal cavity. Hemostasis was confirmed, and the specimen was extracted from the abdominal cavity in a laparoscopic specimen bag. The remainder of gynecologic oncology procedure was completed as described by primary surgeon.   I was present for all aspects of the above procedure, and no operative complications were apparent.

## 2017-07-07 NOTE — Discharge Instructions (Signed)

## 2017-07-08 ENCOUNTER — Telehealth: Payer: Self-pay

## 2017-07-08 LAB — SURGICAL PATHOLOGY

## 2017-07-08 LAB — CYTOLOGY - NON PAP

## 2017-07-08 NOTE — Op Note (Signed)
Operative Note   Date July 07, 2017   PRE-OP DIAGNOSIS: Pelvic mass   POST-OP DIAGNOSIS: Pelvic mass, mucinous neoplasm, pending final pathology  SURGEON: Surgeon(s) and Role: Panel 1:  Prentice Docker, MD  ASSISTANT:  Gillis Ends, MD  ANESTHESIA: Choice   PROCEDURE: Procedure(s): Robotic TLH, BSO, and appendectomy  ESTIMATED BLOOD LOSS: Minimal  DRAINS: Foley  TOTAL IV FLUIDS: 400 cc  SPECIMENS:  Uterus, cervix, bilateral ovaries and tubes, washings, appendix (from Dr. Rosana Hoes portion of the case)  COMPLICATIONS: None  DISPOSITION: Stable to PACU  CONDITION: Stable   INDICATIONS: Pelvic mass  FINDINGS: Exam under anesthesia revealed a 6 cm mobile anteverted uterus. There were no adnexal masses or nodularity. The parametria was smooth. The cervix was negative for gross lesions. Intraoperative findings included: The uterus was normal at 5-6 cm. The adnexa were significant for 12 cm right ovarian mass; normal tubes bilaterally, and normal left ovary. The upper abdomen was normal including omentum, bowel, liver, stomach, and diaphragmatic surfaces. There was no evidence of grossly malignant pelvic or right para-aortic lymph nodes. The appendix appeared full and the tip firm to palpation. GSU consulted.   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    PROCEDURE IN DETAIL: After informed consent was obtained, the patient was taken to the operating room where anesthesia was obtained without difficulty. The patient was positioned in the dorsal lithotomy position in [place] and her arms were carefully tucked at her sides and the usual precautions were taken.  She was prepped and draped in normal sterile fashion.  Time-out was performed and a Foley catheter was placed into the bladder and a standard VCare uterine manipulator was then placed in the uterus without incident.    A suprapubic  incision as made and carried down through the various layers. The Hasson port was placed and direct visualization confirmed peritoneal placement. The pelvis and abdomen were evaluated with the findings as noted above. Local anesthetic was used to infiltrate port sites and the underlying peritoneum. Two bilateral 8 mm robotic ports were placed with care to avoid the epigastric vessels bilaterally. A LUQ 5-12 mm assistant port was placed under visualization.   The patient was placed in Trendelenburg and the bowel was displaced up into the upper abdomen.  Cytologic washings were obtained. The robot was docked and instruments inserted under direct visualization.  Round ligaments were divided on each side with the EndoShears and the retroperitoneal space was opened bilaterally.  The ureters were identified and preserved and the infundibulopelvic ligaments were skeletonized, sealed and divided with the LigaSure device. The right utero-ovarian ligament was transected and the specimen placed in the upper abdomen. A bladder flap was created and the bladder was dissected down off the lower uterine segment and cervix using endoshears and electrocautery.  The uterine arteries were skeletonized bilaterally, sealed and divided with the LigaSure device.  A colpotomy was performed circumferentially along the V-Care ring with electrocautery and the cervix was incised from the vagina and the uterus, cervix, and left adnexal specimens were removed through the vagina. A 15 mm Endocatch bag was placed in the vagina and the right ovarian specimen placed into the bag. During this process the cystic wall was ruptured and a small amount clear fluid escaped. The bag and specimen were removed via the vagina. The cyst was drained with care to avoid further spillage and passed off the sterile field for intraoperative Frozen section.   A pneumo balloon was  placed in the vagina and the vaginal cuff was then closed in a running continuous  fashion using the EndoStitch technique with 0 V-Lock suture with careful attention to include the vaginal cuff angles and the vaginal mucosa within the closure.  The intraperitoneal pressure was dropped, and all planes of dissection, vascular pedicles and the vaginal cuff were found to be hemostatic except the left vaginal cuff. Hemostasis was obtained with further cauterization of the edge and fibrillar.    The appendectomy was performed in conjunction with Dr. Rosana Hoes. Please see his note for complete detail.   The suprapubic trocar was removed and the fascia was closed with 0 Vicryl suture using the Endoclose technique. The lateral trocars were removed under visualization.   Before the umbilical trocar was removed the CO2 gas was released.  The fascia there was closed with 0 Vicryl suture in interrupted technique.   The skin incisions at all other sites were closed with subcuticular stitch and surgical skin glue.  The patient tolerated the procedure well.  Sponge, lap and needle counts were correct x2.  The patient was taken to recovery room in excellent condition.  Antibiotics: Ancef 2 grams prior to start of procedure, Antibiotics given within 1 hour of the start of the procedure, Antibiotics ordered to be discontinued within 24 hours post procedure.  General surgery was queried about the need for additional prophylactic antibiotics for the appendectomy and Dr. Rosana Hoes indicated that Ancef 2 grams should be sufficient.   VTE prophylaxis: SCDs were placed prior to the procedure and were in place and operating throughout the procedure.  I assisted Dr. Glennon Mac with the procedure.  Gillis Ends, MD  I personally was present throughout the entirety of the case. I agree with the details of the surgery as described above. I participated as Pharmacologist with Dr. Santiago Glad assisting, as no other suitable and qualified assistant was available.  She was also present in case a clear and  obvious cancer was identified so that the patient could have any additional indicated procedures performed by a board certified gynecologic oncologist.  Prentice Docker, MD, [facility] 07/08/2017 1:06 PM

## 2017-07-08 NOTE — Anesthesia Postprocedure Evaluation (Signed)
Anesthesia Post Note  Patient: Halston Fairclough  Procedure(s) Performed: ROBOTIC ASSISTED TOTAL HYSTERECTOMY (N/A ) ROBOTIC ASSISTED BILATERAL SALPINGO OOPHORECTOMY (Bilateral ) APPENDECTOMY LAPAROSCOPIC  Patient location during evaluation: PACU Anesthesia Type: General Level of consciousness: awake and alert Pain management: pain level controlled Vital Signs Assessment: post-procedure vital signs reviewed and stable Respiratory status: spontaneous breathing, nonlabored ventilation, respiratory function stable and patient connected to nasal cannula oxygen Cardiovascular status: blood pressure returned to baseline and stable Postop Assessment: no apparent nausea or vomiting Anesthetic complications: no     Last Vitals:  Vitals:   07/07/17 1140 07/07/17 1200  BP: (!) 170/82 (!) 163/60  Pulse: 60 68  Resp: 17 16  Temp:  37.1 C  SpO2: 96% 97%    Last Pain:  Vitals:   07/08/17 0817  TempSrc:   PainSc: 6                  Molli Barrows

## 2017-07-08 NOTE — Telephone Encounter (Signed)
Called pt this AM for post op check up. No abnormal issues. Just sore, taking the pain medication. No fever, chills, nausea. Pt doing well and will call back with any problems.

## 2017-07-09 ENCOUNTER — Telehealth: Payer: Self-pay | Admitting: Obstetrics and Gynecology

## 2017-07-09 NOTE — Telephone Encounter (Signed)
Spoke with patient to assess recovery so far and relay final pathology results. 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 She is ambulating, passing flatus, tolerating PO. Her pain is well-controlled and she is requiring less pain medication daily.   She will keep her follow up appt next Wednesday.  All questions answered.

## 2017-07-11 DIAGNOSIS — R1900 Intra-abdominal and pelvic swelling, mass and lump, unspecified site: Secondary | ICD-10-CM

## 2017-07-12 ENCOUNTER — Telehealth: Payer: Self-pay | Admitting: Obstetrics and Gynecology

## 2017-07-12 NOTE — Telephone Encounter (Signed)
Patient missed a call this morning, same cb

## 2017-07-14 ENCOUNTER — Ambulatory Visit: Payer: Self-pay | Admitting: Obstetrics and Gynecology

## 2017-07-14 ENCOUNTER — Encounter: Payer: Self-pay | Admitting: Obstetrics and Gynecology

## 2017-07-14 ENCOUNTER — Telehealth: Payer: Self-pay

## 2017-07-14 ENCOUNTER — Ambulatory Visit (INDEPENDENT_AMBULATORY_CARE_PROVIDER_SITE_OTHER): Payer: 59 | Admitting: Obstetrics and Gynecology

## 2017-07-14 VITALS — BP 122/78 | Ht 63.0 in | Wt 120.0 lb

## 2017-07-14 DIAGNOSIS — Z09 Encounter for follow-up examination after completed treatment for conditions other than malignant neoplasm: Secondary | ICD-10-CM

## 2017-07-14 NOTE — Progress Notes (Signed)
   Postoperative Follow-up Patient presents post op from robot-assisted TLH/BSO/removal of right ovarian mass, incidental appendectomy 1weeks ago for adnexal mass.  Subjective: Patient reports marked improvement in her preop symptoms. Eating a regular diet without difficulty. Pain is controlled with current analgesics. Medications being used: prescription NSAID's including ibuprofen and narcotic analgesics including percocet (now taking only one per day at most).  Activity: increasing appropriately.  Objective: Vitals:   07/14/17 1635  BP: 122/78   Vital Signs: BP 122/78   Ht 5\' 3"  (1.6 m)   Wt 120 lb (54.4 kg)   LMP 07/03/2006   BMI 21.26 kg/m  Constitutional: Well nourished, well developed female in no acute distress.  HEENT: normal Skin: Warm and dry.  Extremity: no edema  Abdomen: Soft, non-tender, normal bowel sounds; no bruits, organomegaly or masses. clean, dry, intact and without erythema, induration, warmth, and tenderness  Assessment: 57 y.o. s/p the above-named surgery progressing well  Plan: Patient has done well after surgery with no apparent complications.  I have discussed the post-operative course to date, and the expected progress moving forward.  The patient understands what complications to be concerned about.  I will see the patient in routine follow up, or sooner if needed.    Activity plan: increase slowly  Possible return to work at 3-4 weeks, otherwise at 6 weeks from surgery.   Prentice Docker, MD 07/14/2017, 6:08 PM

## 2017-07-14 NOTE — Telephone Encounter (Signed)
Noted that Dr. Glennon Mac notified of benign pathology. Courtesy call placed to check on her post op and remind her that she does not need to follow up at the cancer center. Oncology Nurse Navigator Documentation  Navigator Location: [facility] (07/14/17 0900)   )Navigator Encounter Type: Telephone;Diagnostic Results (07/14/17 0900)                                                    Time Spent with Patient: 15 (07/14/17 0900)

## 2017-08-05 ENCOUNTER — Ambulatory Visit (INDEPENDENT_AMBULATORY_CARE_PROVIDER_SITE_OTHER): Payer: 59 | Admitting: Obstetrics and Gynecology

## 2017-08-05 ENCOUNTER — Encounter: Payer: Self-pay | Admitting: Obstetrics and Gynecology

## 2017-08-05 VITALS — BP 110/70 | HR 72 | Ht 63.5 in | Wt 124.0 lb

## 2017-08-05 DIAGNOSIS — R1900 Intra-abdominal and pelvic swelling, mass and lump, unspecified site: Secondary | ICD-10-CM

## 2017-08-05 DIAGNOSIS — Z09 Encounter for follow-up examination after completed treatment for conditions other than malignant neoplasm: Secondary | ICD-10-CM

## 2017-08-05 NOTE — Progress Notes (Signed)
   Postoperative Follow-up Patient presents post op from robot assisted TLH/BSO/removal of right ovarian mass, incidental appendectomy 4 weeks ago for adnexal mass.  Subjective: Patient reports marked improvement in her preop symptoms. Eating a regular diet without difficulty. The patient is not having any pain.  Activity: normal activities of daily living.  Objective: Vitals:   08/05/17 1113  BP: 110/70  Pulse: 72   Vital Signs: BP 110/70   Pulse 72   Ht 5' 3.5" (1.613 m)   Wt 124 lb (56.2 kg)   LMP 07/03/2006   BMI 21.62 kg/m  Constitutional: Well nourished, well developed female in no acute distress.  HEENT: normal Skin: Warm and dry.  Extremity: no edema  Abdomen: Soft, non-tender, normal bowel sounds; no bruits, organomegaly or masses. clean, dry, intact and without erythema, induration, warmth, and tendernes  Pelvic exam: (female chaperone present) is not limited by body habitus EGBUS: within normal limits Vagina: within normal limits and with normal mucosa blood in the vault Vaginal cuff: intact with small stitching visible. Small ~54mm left sided redundant tissue versus granulation tissue present. Non-tender, it is not bleeding in that area.      Assessment: 57 y.o. s/p robot assisted TLH/BSO/removal of right ovarian mass, incidental appendectomy progressing well  Plan: Patient has done well after surgery with no apparent complications.  I have discussed the post-operative course to date, and the expected progress moving forward.  The patient understands what complications to be concerned about.  I will see the patient in routine follow up, or sooner if needed.    Activity plan: May return to work in 2 days with restriction of no lifting heavier than 20 pounds for the first two weeks with no restrictions afterward.  No intercourse until 8 weeks postop.   Prentice Docker, MD 08/05/2017, 11:37 AM

## 2018-04-08 ENCOUNTER — Telehealth: Payer: Self-pay

## 2018-04-08 NOTE — Telephone Encounter (Signed)
Pt calling for referral to [facility] for colonoscopy.  [phone]

## 2018-04-08 NOTE — Telephone Encounter (Signed)
Please advise 

## 2018-04-10 NOTE — Telephone Encounter (Signed)
You were the last to see her. I haven't seen her since on Epic. Can you do this?

## 2018-04-11 ENCOUNTER — Other Ambulatory Visit: Payer: Self-pay | Admitting: Obstetrics and Gynecology

## 2018-04-11 DIAGNOSIS — Z1211 Encounter for screening for malignant neoplasm of colon: Secondary | ICD-10-CM

## 2019-06-15 ENCOUNTER — Other Ambulatory Visit: Payer: Self-pay

## 2019-06-15 ENCOUNTER — Ambulatory Visit (INDEPENDENT_AMBULATORY_CARE_PROVIDER_SITE_OTHER): Payer: 59 | Admitting: Obstetrics and Gynecology

## 2019-06-15 ENCOUNTER — Encounter: Payer: Self-pay | Admitting: Obstetrics and Gynecology

## 2019-06-15 VITALS — BP 127/67 | HR 66 | Ht 63.0 in | Wt 111.0 lb

## 2019-06-15 DIAGNOSIS — F17210 Nicotine dependence, cigarettes, uncomplicated: Secondary | ICD-10-CM | POA: Diagnosis not present

## 2019-06-15 DIAGNOSIS — Z0000 Encounter for general adult medical examination without abnormal findings: Secondary | ICD-10-CM

## 2019-06-15 DIAGNOSIS — R634 Abnormal weight loss: Secondary | ICD-10-CM | POA: Diagnosis not present

## 2019-06-15 DIAGNOSIS — Z1331 Encounter for screening for depression: Secondary | ICD-10-CM

## 2019-06-15 DIAGNOSIS — Z1231 Encounter for screening mammogram for malignant neoplasm of breast: Secondary | ICD-10-CM

## 2019-06-15 DIAGNOSIS — Z1339 Encounter for screening examination for other mental health and behavioral disorders: Secondary | ICD-10-CM

## 2019-06-15 DIAGNOSIS — Z122 Encounter for screening for malignant neoplasm of respiratory organs: Secondary | ICD-10-CM

## 2019-06-15 DIAGNOSIS — Z90710 Acquired absence of both cervix and uterus: Secondary | ICD-10-CM | POA: Diagnosis not present

## 2019-06-15 DIAGNOSIS — Z01419 Encounter for gynecological examination (general) (routine) without abnormal findings: Secondary | ICD-10-CM | POA: Diagnosis not present

## 2019-06-15 NOTE — Progress Notes (Signed)
Routine Annual Gynecology Examination   PCP: Will Bonnet, MD  Chief Complaint  Patient presents with  . Gynecologic Exam    Weight loss  . Anxiety    Taking care of her elderly mother    History of Present Illness: Patient is a 59 y.o. G0P0000 presents for annual exam. The patient has no complaints today.   Menopausal bleeding: denies  Menopausal symptoms: denies  Breast symptoms: denies  Last pap smear: 2 years ago.  Result Normal  Last mammogram: 6 years ago.  Result Normal  She notes weight loss and loss of muscle tone. She notes she started losing weight last fall.  Her partner has noticed for the last 8-9 months.  She notes no other symptoms that would explain this loss of weight and muscle mass.  She denies blood in her stool and dark, tarry stool.  She had a colonoscopy last year. She was told the result was normal.  She was told to return in 3 years due to her family history. She denies night sweats.   She denies new lumps or bumps on her body. She denies any B symptoms. She continues to smoke about 15 cigarettes per day.  She denies new cough, trouble breathing, wheezing.    Past Medical History:  Diagnosis Date  . Arthritis    HANDS  . Headache    H/O MIGRAINES  . Pelvic mass     Past Surgical History:  Procedure Laterality Date  . LAPAROSCOPIC APPENDECTOMY  07/07/2017   Procedure: APPENDECTOMY LAPAROSCOPIC;  Surgeon: Vickie Epley, MD;  Location: [facility] ORS;  Service: General;;  . ROBOTIC ASSISTED BILATERAL SALPINGO OOPHERECTOMY Bilateral 07/07/2017   Procedure: ROBOTIC ASSISTED BILATERAL SALPINGO OOPHORECTOMY;  Surgeon: Gillis Ends, MD;  Location: [facility] ORS;  Service: Gynecology;  Laterality: Bilateral;  . ROBOTIC ASSISTED TOTAL HYSTERECTOMY N/A 07/07/2017   Procedure: ROBOTIC ASSISTED TOTAL HYSTERECTOMY;  Surgeon: Gillis Ends, MD;  Location: [facility] ORS;  Service: Gynecology;  Laterality: N/A;  . WISDOM TOOTH EXTRACTION       Prior to Admission medications   Medication Sig Start Date End Date Taking? Authorizing Provider  aspirin EC 81 MG tablet Take 162 mg by mouth every 6 (six) hours as needed for moderate pain.   Yes [provider]   Allergies: No Known Allergies  Obstetric History: G0P0000  Social History   Socioeconomic History  . Marital status: Single    Spouse name: Not on file  . Number of children: Not on file  . Years of education: Not on file  . Highest education level: Not on file  Occupational History  . Not on file  Tobacco Use  . Smoking status: Current Every Day Smoker    Packs/day: 0.50    Years: 30.00    Pack years: 15.00    Types: Cigarettes  . Smokeless tobacco: Never Used  Substance and Sexual Activity  . Alcohol use: No  . Drug use: Yes    Types: Marijuana  . Sexual activity: Yes    Birth control/protection: Post-menopausal  Other Topics Concern  . Not on file  Social History Narrative  . Not on file   Social Determinants of Health   Financial Resource Strain:   . Difficulty of Paying Living Expenses:   Food Insecurity:   . Worried About Charity fundraiser in the Last Year:   . Arboriculturist in the Last Year:   Transportation Needs:   . Film/video editor (Medical):   Marland Kitchen  Lack of Transportation (Non-Medical):   Physical Activity:   . Days of Exercise per Week:   . Minutes of Exercise per Session:   Stress:   . Feeling of Stress :   Social Connections:   . Frequency of Communication with Friends and Family:   . Frequency of Social Gatherings with Friends and Family:   . Attends Religious Services:   . Active Member of Clubs or Organizations:   . Attends Archivist Meetings:   Marland Kitchen Marital Status:   Intimate Partner Violence:   . Fear of Current or Ex-Partner:   . Emotionally Abused:   Marland Kitchen Physically Abused:   . Sexually Abused:    Family history: denies history of gynecologic cancer  Review of Systems  Constitutional:  Negative.   HENT: Negative.   Eyes: Negative.   Respiratory: Negative.   Cardiovascular: Negative.   Gastrointestinal: Negative.   Genitourinary: Negative.   Musculoskeletal: Negative.   Skin: Negative.   Neurological: Negative.   Psychiatric/Behavioral: Negative.      Physical Exam Vitals: BP 127/67   Pulse 66   Ht 5\' 3"  (1.6 m)   Wt 111 lb (50.3 kg)   LMP 07/03/2006   BMI 19.66 kg/m   Physical Exam Constitutional:      General: She is not in acute distress.    Appearance: Normal appearance. She is well-developed.  Genitourinary:     Pelvic exam was performed with patient in the lithotomy position.     Vulva, urethra and bladder normal.     No inguinal adenopathy present in the right or left side.    No signs of injury in the vagina.     No vaginal discharge, erythema, tenderness or bleeding.     Cervix is absent.     Uterus is absent.     Right adnexa absent.     Left adnexa absent.  HENT:     Head: Normocephalic and atraumatic.  Eyes:     General: No scleral icterus.    Conjunctiva/sclera: Conjunctivae normal.  Neck:     Thyroid: No thyromegaly.  Cardiovascular:     Rate and Rhythm: Normal rate and regular rhythm.     Heart sounds: No murmur. No friction rub. No gallop.   Pulmonary:     Effort: Pulmonary effort is normal. No respiratory distress.     Breath sounds: Normal breath sounds. No wheezing or rales.  Chest:     Breasts:        Right: No inverted nipple, mass, nipple discharge, skin change or tenderness.        Left: No inverted nipple, mass, nipple discharge, skin change or tenderness.  Abdominal:     General: Bowel sounds are normal. There is no distension.     Palpations: Abdomen is soft. There is no mass.     Tenderness: There is no abdominal tenderness. There is no guarding or rebound.  Musculoskeletal:        General: No swelling or tenderness. Normal range of motion.     Cervical back: Normal range of motion and neck supple.   Lymphadenopathy:     Cervical: No cervical adenopathy.     Lower Body: No right inguinal adenopathy. No left inguinal adenopathy.  Neurological:     General: No focal deficit present.     Mental Status: She is alert and oriented to person, place, and time.     Cranial Nerves: No cranial nerve deficit.  Skin:    General: Skin  is warm and dry.     Findings: No erythema or rash.  Psychiatric:        Mood and Affect: Mood normal.        Behavior: Behavior normal.        Judgment: Judgment normal.      Female chaperone present for pelvic and breast  portions of the physical exam  Results: AUDIT Questionnaire (screen for alcoholism): 1 PHQ-9: 2  Assessment and Plan:  59 y.o. G0P0000 female here for routine annual gynecologic examination  Plan: Problem List Items Addressed This Visit    None    Visit Diagnoses    Women's annual routine gynecological examination    -  Primary   Relevant Orders   CBC with Differential/Platelet   Comprehensive metabolic panel   TSH   Lipid Panel With LDL/HDL Ratio   Hgb A1c w/o eAG   CT CHEST LUNG CA SCREEN LOW DOSE W/O CM   MM DIGITAL SCREENING BILATERAL   Screening for depression       Screening for alcoholism       Abnormal weight loss       Relevant Orders   TSH   Laboratory examination ordered as part of a routine general medical examination       Relevant Orders   CBC with Differential/Platelet   Comprehensive metabolic panel   Lipid Panel With LDL/HDL Ratio   Hgb A1c w/o eAG   Encounter for screening for lung cancer       Relevant Orders   CT CHEST LUNG CA SCREEN LOW DOSE W/O CM   Smoking greater than 20 pack years       Relevant Orders   CT CHEST LUNG CA SCREEN LOW DOSE W/O CM   Encounter for screening mammogram for malignant neoplasm of breast       Relevant Orders   MM DIGITAL SCREENING BILATERAL      Screening: -- Blood pressure screen normal -- Colonoscopy - not due -- Mammogram - due. Patient to call [place] to  arrange. She understands that it is her responsibility to arrange this. -- Weight screening: normal -- Depression screening negative (PHQ-9) -- Nutrition: normal -- cholesterol screening: will obtain -- osteoporosis screening: not due -- tobacco screening: using: discussed quitting using the 5 A's. Per USPSTF, will get chest CT (low dose) for screening for lung cancer.  -- alcohol screening: AUDIT questionnaire indicates low-risk usage. -- family history of breast cancer screening: done. not at high risk. -- no evidence of domestic violence or intimate partner violence. -- STD screening: gonorrhea/chlamydia NAAT not collected per patient request. -- pap smear not collected per ASCCP guidelines -- flu vaccine declines every year -- HPV vaccination series: not eligilbe   Unexplained weight loss: Normal recent colonoscopy, no reproductive organs. Will check CBC and basic labs, and obtain chest CT with history of smoking (>20 pack-year). She has no symptoms.    Prentice Docker, MD 06/15/2019 5:59 PM

## 2019-06-16 LAB — CBC WITH DIFFERENTIAL/PLATELET
Basophils Absolute: 0.1 10*3/uL (ref 0.0–0.2)
Basos: 1 %
EOS (ABSOLUTE): 0.1 10*3/uL (ref 0.0–0.4)
Eos: 2 %
Hematocrit: 41.8 % (ref 34.0–46.6)
Hemoglobin: 14 g/dL (ref 11.1–15.9)
Immature Grans (Abs): 0 10*3/uL (ref 0.0–0.1)
Immature Granulocytes: 0 %
Lymphocytes Absolute: 1.8 10*3/uL (ref 0.7–3.1)
Lymphs: 32 %
MCH: 31.3 pg (ref 26.6–33.0)
MCHC: 33.5 g/dL (ref 31.5–35.7)
MCV: 94 fL (ref 79–97)
Monocytes Absolute: 0.4 10*3/uL (ref 0.1–0.9)
Monocytes: 8 %
Neutrophils Absolute: 3.2 10*3/uL (ref 1.4–7.0)
Neutrophils: 57 %
Platelets: 177 10*3/uL (ref 150–450)
RBC: 4.47 x10E6/uL (ref 3.77–5.28)
RDW: 12.1 % (ref 11.7–15.4)
WBC: 5.5 10*3/uL (ref 3.4–10.8)

## 2019-06-16 LAB — LIPID PANEL WITH LDL/HDL RATIO
Cholesterol, Total: 197 mg/dL (ref 100–199)
HDL: 43 mg/dL (ref >39)
LDL Chol Calc (NIH): 134 mg/dL — ABNORMAL HIGH (ref 0–99)
LDL/HDL Ratio: 3.1 ratio (ref 0.0–3.2)
Triglycerides: 108 mg/dL (ref 0–149)
VLDL Cholesterol Cal: 20 mg/dL (ref 5–40)

## 2019-06-16 LAB — COMPREHENSIVE METABOLIC PANEL
ALT: 23 IU/L (ref 0–32)
AST: 27 IU/L (ref 0–40)
Albumin/Globulin Ratio: 1.7 (ref 1.2–2.2)
Albumin: 4.2 g/dL (ref 3.8–4.9)
Alkaline Phosphatase: 112 IU/L (ref 39–117)
BUN/Creatinine Ratio: 15 (ref 9–23)
BUN: 11 mg/dL (ref 6–24)
Bilirubin Total: <0.2 mg/dL (ref 0.0–1.2)
CO2: 26 mmol/L (ref 20–29)
Calcium: 9.3 mg/dL (ref 8.7–10.2)
Chloride: 102 mmol/L (ref 96–106)
Creatinine, Ser: 0.75 mg/dL (ref 0.57–1.00)
GFR calc Af Amer: 102 mL/min/{1.73_m2} (ref >59)
GFR calc non Af Amer: 89 mL/min/{1.73_m2} (ref >59)
Globulin, Total: 2.5 g/dL (ref 1.5–4.5)
Glucose: 94 mg/dL (ref 65–99)
Potassium: 4.2 mmol/L (ref 3.5–5.2)
Sodium: 139 mmol/L (ref 134–144)
Total Protein: 6.7 g/dL (ref 6.0–8.5)

## 2019-06-16 LAB — TSH: TSH: 1.55 u[IU]/mL (ref 0.450–4.500)

## 2019-06-16 LAB — HGB A1C W/O EAG: Hgb A1c MFr Bld: 5.9 % — ABNORMAL HIGH (ref 4.8–5.6)

## 2019-06-23 ENCOUNTER — Other Ambulatory Visit: Payer: Self-pay | Admitting: Obstetrics and Gynecology

## 2019-06-23 ENCOUNTER — Ambulatory Visit: Admission: RE | Admit: 2019-06-23 | Payer: 59 | Source: Ambulatory Visit

## 2019-06-23 DIAGNOSIS — Z122 Encounter for screening for malignant neoplasm of respiratory organs: Secondary | ICD-10-CM

## 2019-06-23 DIAGNOSIS — F17210 Nicotine dependence, cigarettes, uncomplicated: Secondary | ICD-10-CM

## 2019-06-23 DIAGNOSIS — Z01419 Encounter for gynecological examination (general) (routine) without abnormal findings: Secondary | ICD-10-CM

## 2019-06-29 ENCOUNTER — Telehealth: Payer: Self-pay | Admitting: Obstetrics and Gynecology

## 2019-06-29 NOTE — Telephone Encounter (Signed)
Left generic VM 

## 2019-07-03 NOTE — Telephone Encounter (Signed)
Patient is calling to follow up on lab results. Patient was sent info on the Mychart link. Patient states she is going to look it up but asking Dr. Glennon Mac to give her a call if he was concerned about her labs. Please advise

## 2019-07-11 ENCOUNTER — Ambulatory Visit
Admission: RE | Admit: 2019-07-11 | Discharge: 2019-07-11 | Disposition: A | Discharge status: Home / Self Care | Payer: 59 | Source: Ambulatory Visit | Attending: Obstetrics and Gynecology | Admitting: Obstetrics and Gynecology

## 2019-07-11 ENCOUNTER — Other Ambulatory Visit: Payer: Self-pay

## 2019-07-11 DIAGNOSIS — Z122 Encounter for screening for malignant neoplasm of respiratory organs: Secondary | ICD-10-CM | POA: Insufficient documentation

## 2019-07-11 DIAGNOSIS — F17210 Nicotine dependence, cigarettes, uncomplicated: Secondary | ICD-10-CM | POA: Insufficient documentation

## 2019-07-11 DIAGNOSIS — Z01419 Encounter for gynecological examination (general) (routine) without abnormal findings: Secondary | ICD-10-CM | POA: Diagnosis present

## 2019-07-11 NOTE — Telephone Encounter (Signed)
Discussed labs and CT scan with patient. Particularly, discussed pre-diabetes state and emphysema on CT.  Also, discussed mild atherosclerotic changes noted on the aorta. All questions answered. Again, encouraged smoking cessation.

## 2020-01-09 ENCOUNTER — Telehealth: Payer: Self-pay

## 2020-01-09 NOTE — Telephone Encounter (Signed)
Pt thinks she needs labs to check her hormone level, pt aware SDJ is not in the office until Friday, and she will get a call to let her know if she needs an appointment or just labs.

## 2020-01-10 NOTE — Telephone Encounter (Signed)
This pt should schedule an appt with jackson. Can you please call her? Last time she was see was over 6 months ago

## 2020-01-10 NOTE — Telephone Encounter (Signed)
Patient is scheduled for 02/05/20 with SDJ

## 2020-02-05 ENCOUNTER — Other Ambulatory Visit: Payer: Self-pay

## 2020-02-05 ENCOUNTER — Ambulatory Visit (INDEPENDENT_AMBULATORY_CARE_PROVIDER_SITE_OTHER): Payer: 59 | Admitting: Obstetrics and Gynecology

## 2020-02-05 ENCOUNTER — Encounter: Payer: Self-pay | Admitting: Obstetrics and Gynecology

## 2020-02-05 VITALS — BP 116/74 | Ht 63.0 in | Wt 111.0 lb

## 2020-02-05 DIAGNOSIS — R454 Irritability and anger: Secondary | ICD-10-CM

## 2020-02-05 NOTE — Progress Notes (Signed)
Obstetrics & Gynecology Office Visit   Chief Complaint  Patient presents with  . Follow-up    History of Present Illness: 59 y.o. G0P0000 female who presents for symptoms of irritability.  She states that her wife has told her that she is more irritable.  She believes this has been going on since January 2021. States that her mother was in failing health and she had been her care giver.  Her mother had been diagnosed with colon cancer. Her mother was 32 years old.  Her mother passed in April.  Her wife thinks that she is short-tempered and irritable.  She has been with her wife for 4-5 years. She and her wife have only been living together for a year.   The patient thinks that she hasn't changed very much.  She denies menopausal issues.  She has no hot flashes. She states that she would not like to take any medication that might blunt her experience of life or cause her to be in [place].  Past Medical History:  Diagnosis Date  . Arthritis    HANDS  . Headache    H/O MIGRAINES  . Pelvic mass     Past Surgical History:  Procedure Laterality Date  . LAPAROSCOPIC APPENDECTOMY  07/07/2017   Procedure: APPENDECTOMY LAPAROSCOPIC;  Surgeon: Vickie Epley, MD;  Location: [facility] OR[facility];  Service: General;;  . ROBOTIC ASSISTED BILATERAL SALPINGO OOPHERECTOMY Bilateral 07/07/2017   Procedure: ROBOTIC ASSISTED BILATERAL SALPINGO OOPHORECTOMY;  Surgeon: Gillis Ends, MD;  Location: [facility] OR[facility];  Service: Gynecology;  Laterality: Bilateral;  . ROBOTIC ASSISTED TOTAL HYSTERECTOMY N/A 07/07/2017   Procedure: ROBOTIC ASSISTED TOTAL HYSTERECTOMY;  Surgeon: Gillis Ends, MD;  Location: [facility] OR[facility];  Service: Gynecology;  Laterality: N/A;  . WISDOM TOOTH EXTRACTION      Gynecologic History: Patient's last menstrual period was 07/03/2006.  Obstetric History: G0P0000  Family History  Problem Relation Age of Onset  . Diabetes Maternal Grandmother   . Colon cancer Mother     Social  History   Socioeconomic History  . Marital status: Single    Spouse name: Not on file  . Number of children: Not on file  . Years of education: Not on file  . Highest education level: Not on file  Occupational History  . Not on file  Tobacco Use  . Smoking status: Current Every Day Smoker    Packs/day: 0.50    Years: 30.00    Pack years: 15.00    Types: Cigarettes  . Smokeless tobacco: Never Used  Vaping Use  . Vaping Use: Never used  Substance and Sexual Activity  . Alcohol use: No  . Drug use: Yes    Types: Marijuana  . Sexual activity: Yes    Birth control/protection: Post-menopausal  Other Topics Concern  . Not on file  Social History Narrative  . Not on file   Social Determinants of Health   Financial Resource Strain:   . Difficulty of Paying Living Expenses: Not on file  Food Insecurity:   . Worried About Charity fundraiser in the Last Year: Not on file  . Ran Out of Food in the Last Year: Not on file  Transportation Needs:   . Lack of Transportation (Medical): Not on file  . Lack of Transportation (Non-Medical): Not on file  Physical Activity:   . Days of Exercise per Week: Not on file  . Minutes of Exercise per Session: Not on file  Stress:   . Feeling of Stress :  Not on file  Social Connections:   . Frequency of Communication with Friends and Family: Not on file  . Frequency of Social Gatherings with Friends and Family: Not on file  . Attends Religious Services: Not on file  . Active Member of Clubs or Organizations: Not on file  . Attends Archivist Meetings: Not on file  . Marital Status: Not on file  Intimate Partner Violence:   . Fear of Current or Ex-Partner: Not on file  . Emotionally Abused: Not on file  . Physically Abused: Not on file  . Sexually Abused: Not on file    No Known Allergies  Prior to Admission medications   Medication Sig Start Date End Date Taking? Authorizing Provider  aspirin EC 81 MG tablet Take 162 mg by  mouth every 6 (six) hours as needed for moderate pain.    [provider]    Review of Systems  Constitutional: Negative.   HENT: Negative.   Eyes: Negative.   Respiratory: Negative.   Cardiovascular: Negative.   Gastrointestinal: Negative.   Genitourinary: Negative.   Musculoskeletal: Negative.   Skin: Negative.   Neurological: Negative.   Psychiatric/Behavioral: Negative.      Physical Exam BP 116/74   Ht 5\' 3"  (1.6 m)   Wt 111 lb (50.3 kg)   LMP 07/03/2006   BMI 19.66 kg/m  Patient's last menstrual period was 07/03/2006. Physical Exam Constitutional:      General: She is not in acute distress.    Appearance: Normal appearance.  HENT:     Head: Normocephalic and atraumatic.  Eyes:     General: No scleral icterus.    Conjunctiva/sclera: Conjunctivae normal.  Neurological:     General: No focal deficit present.     Mental Status: She is alert and oriented to person, place, and time.     Cranial Nerves: No cranial nerve deficit.  Psychiatric:        Mood and Affect: Mood normal.        Behavior: Behavior normal.        Judgment: Judgment normal.    Assessment: 59 y.o. G0P0000 female here for  1. Irritability      Plan: Problem List Items Addressed This Visit    None    Visit Diagnoses    Irritability    -  Primary     After long discussion, the patient has had multiple changes in her life circumstances over the past year with her wife living with her for the first time, her mother having to move in due to the need for close care, and the loss of her mother in April.  Additionally, she and her wife are undertaking remodeling of their house with them doing a good portion of the work.  We discussed that these life circumstances can produce friction and if she does feel or seem more irritable, this is likely to be a normal response for her given these very challenging circumstances.  She does not want medication therapy at this time.  We discussed counseling  therapy, which she will consider.  I offered that it might be helpful to have her wife involved in counseling therapy in addition to her own private counseling therapy.  A total of 30 minutes were spent face-to-face with the patient as well as preparation, review, communication, and documentation during this encounter.    Prentice Docker, MD 02/05/2020 12:10 PM

## 2020-02-09 ENCOUNTER — Encounter: Payer: Self-pay | Admitting: Obstetrics and Gynecology

## 2020-04-23 IMAGING — CT CT CHEST LUNG CANCER SCREENING LOW DOSE W/O CM
2 of 5 series · 15 of 40 positions shown, 18 images · non-contrast
Comparison: None.

CLINICAL DATA: 58-year-old female current smoker, with 30 pack-year
history of smoking, for initial lung cancer screening

EXAM:
CT CHEST WITHOUT CONTRAST LOW-DOSE FOR LUNG CANCER SCREENING
TECHNIQUE: Multidetector CT imaging of the chest was performed following the
standard protocol without IV contrast.

[Series 4: lung 1.00 ax · axial · 0.57mm/px · z∈[-1240,-917]mm · 12 of 357 slices shown, 15 images]
[im 17/357  mediastinal]
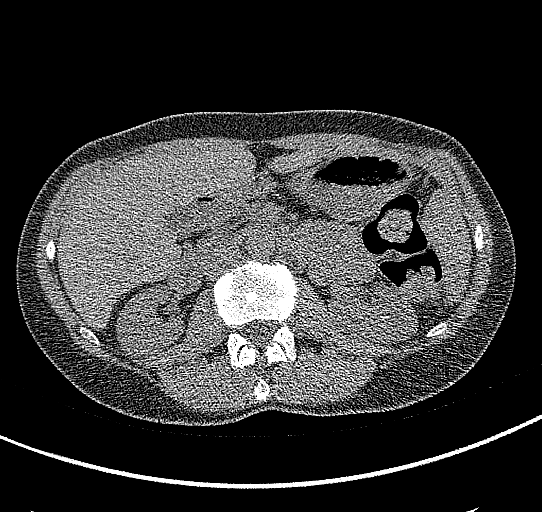
[im 17/357  lung]
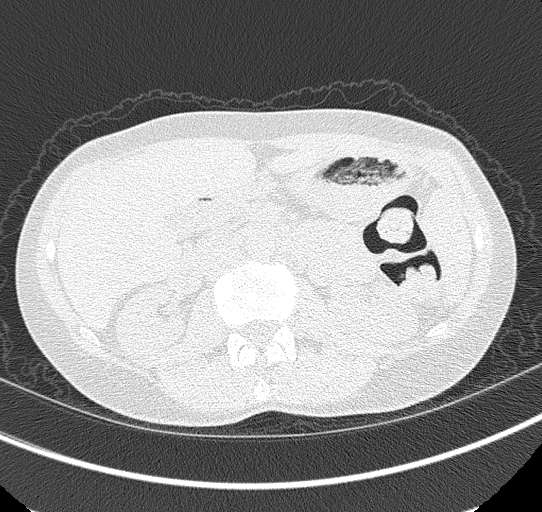
[im 49/357  lung]
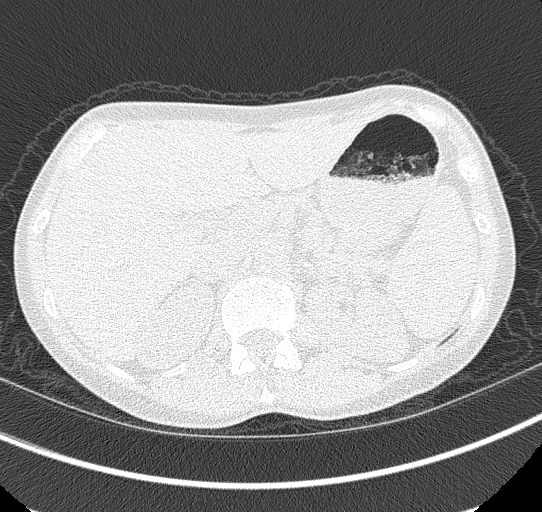
[im 81/357  lung]
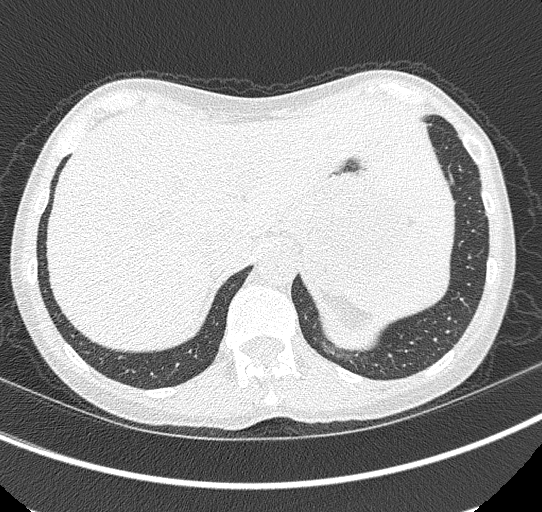
[im 114/357  lung]
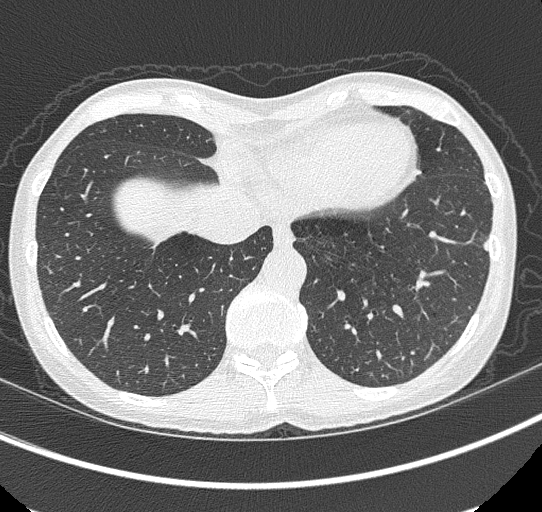
[im 130/357  mediastinal]
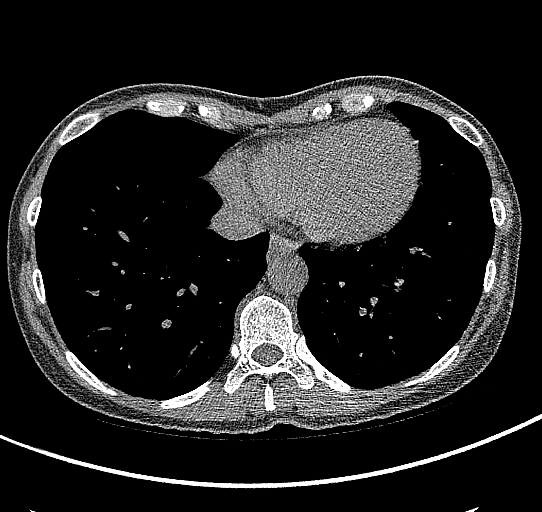
[im 130/357  lung]
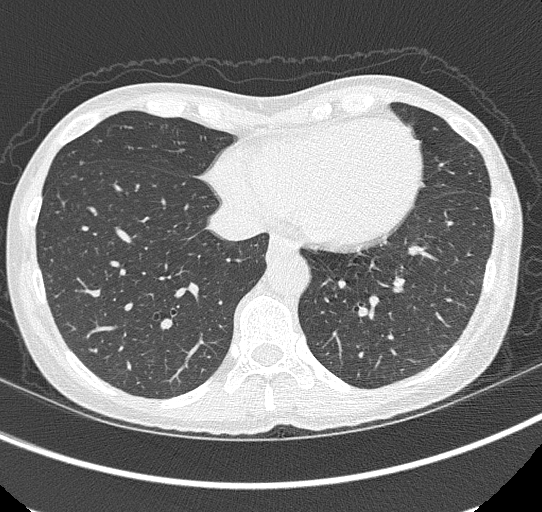
[im 162/357  lung]
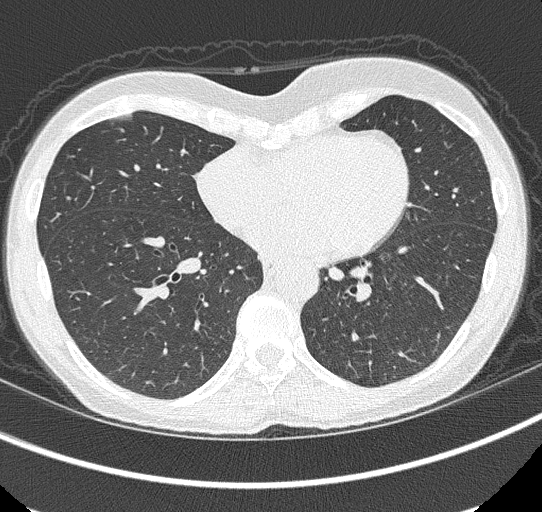
[im 195/357  lung]
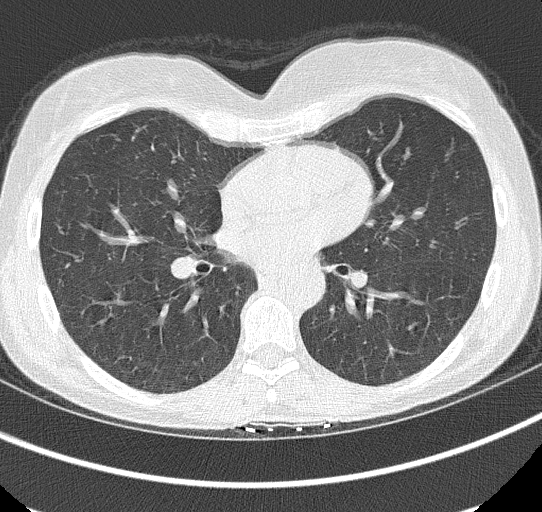
[im 227/357  lung]
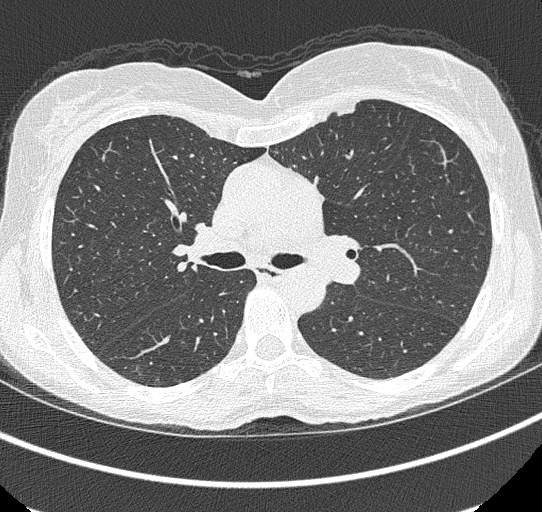
[im 243/357  mediastinal]
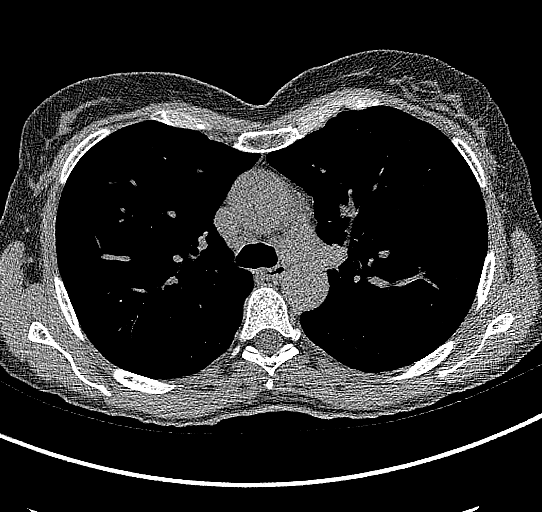
[im 243/357  lung]
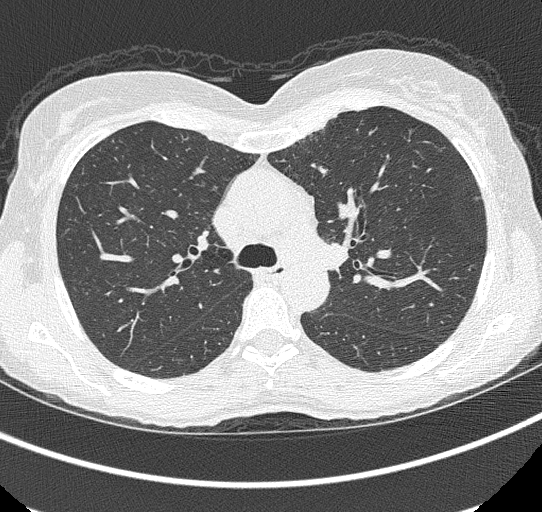
[im 276/357  lung]
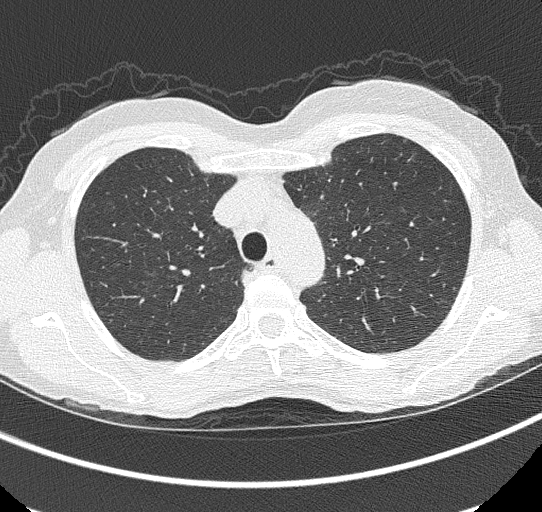
[im 308/357  lung]
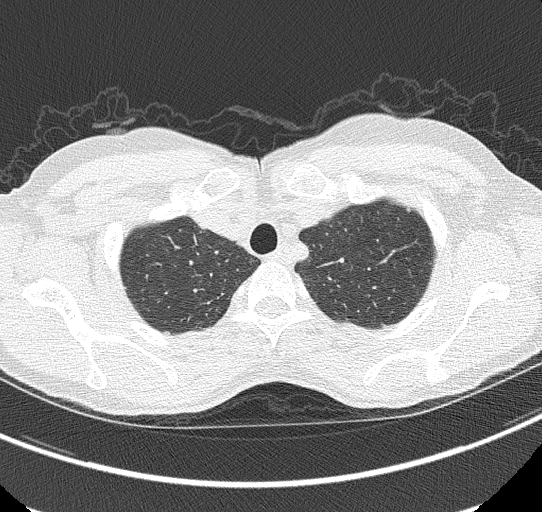
[im 340/357  lung]
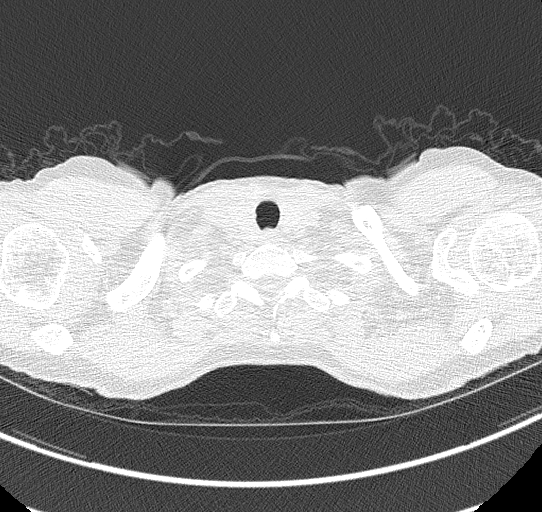

[Series 6: coronals lung 1.00 cor · coronal · 0.60mm/px · 3 of 225 slices shown]
[im 45/225  lung]
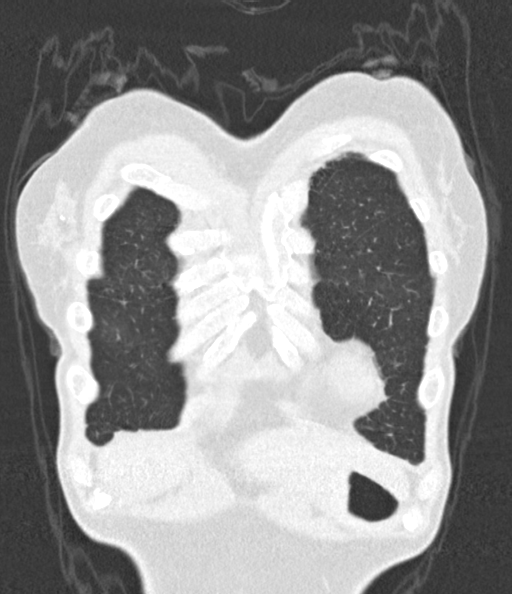
[im 90/225  lung]
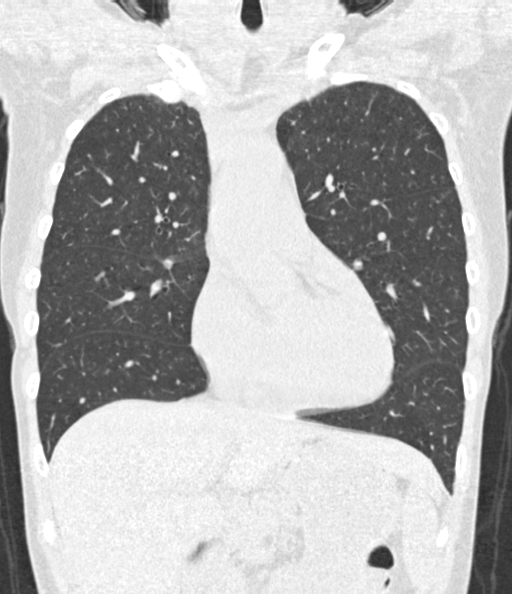
[im 135/225  lung]
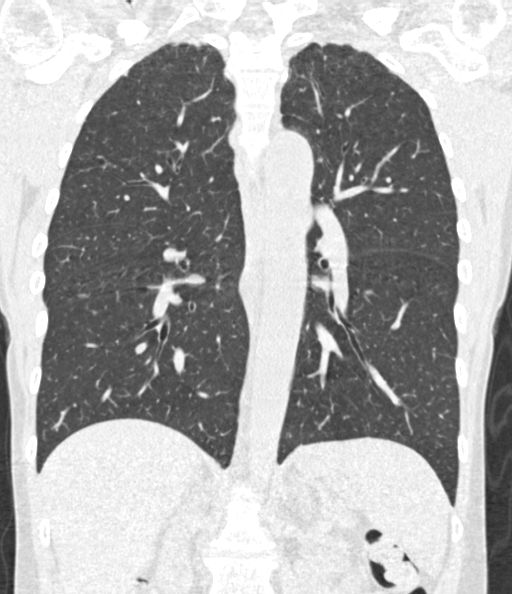

[15 of 40 positions shown; findings below may reference images not displayed]

FINDINGS: Cardiovascular: Heart is normal in size.  No pericardial effusion.

No evidence of thoracic aortic aneurysm. Mild atherosclerotic
calcifications of the aortic arch.

Mediastinum/Nodes: No suspicious mediastinal lymphadenopathy.

Visualized thyroid is unremarkable.

Lungs/Pleura: Biapical pleural-parenchymal scarring.

Mild paraseptal emphysematous changes, upper lung predominant.

Scattered small bilateral pulmonary nodules, measuring up to 5.0 mm
in the lateral left lower lobe.

Evaluation of the lower lobes is mildly constrained by respiratory
motion.

No focal consolidation.

No pleural effusion or pneumothorax.

Upper Abdomen: Visualized upper abdomen is notable for mild vascular
calcifications.

Musculoskeletal: Visualized osseous structures are within normal
limits.
IMPRESSION: Lung-RADS 2, benign appearance or behavior. Continue annual
screening with low-dose chest CT without contrast in 12 months.

Aortic Atherosclerosis (F8MI9-6VJ.J) and Emphysema ([id number].3).

## 2020-10-25 ENCOUNTER — Other Ambulatory Visit: Payer: Self-pay

## 2020-10-25 ENCOUNTER — Emergency Department
Admission: EM | Admit: 2020-10-25 | Discharge: 2020-10-25 | Disposition: A | Discharge status: Home / Self Care | Payer: Federal, State, Local not specified - PPO | Attending: Emergency Medicine | Admitting: Emergency Medicine

## 2020-10-25 ENCOUNTER — Encounter: Payer: Self-pay | Admitting: Emergency Medicine

## 2020-10-25 DIAGNOSIS — Z5321 Procedure and treatment not carried out due to patient leaving prior to being seen by health care provider: Secondary | ICD-10-CM | POA: Diagnosis not present

## 2020-10-25 DIAGNOSIS — U071 COVID-19: Secondary | ICD-10-CM | POA: Diagnosis not present

## 2020-10-25 DIAGNOSIS — R519 Headache, unspecified: Secondary | ICD-10-CM | POA: Diagnosis present

## 2020-10-25 NOTE — ED Triage Notes (Signed)
Pt tested positive for Covid yesterday, here for body aches and headache.

## 2021-04-29 ENCOUNTER — Telehealth: Payer: Self-pay

## 2021-04-29 NOTE — Telephone Encounter (Signed)
Spoke w/patient. Inquired she has been to counseling per Dr. Glennon Mac last visit note recommendations. She did go to group grief counseling after her mother passed in 2021, but did not have a good experience. She is not wanting to do 1 on 1 counseling. Given # for [facility] [phone] who accepts walk-ins, will do assessment and can schedule per patient needs. Also [place] does walk ins. She has contacted several counseling offices in [place] and [place]. None that have/know of an active Anger Management group. She will contact these suggested offices.

## 2021-04-29 NOTE — Telephone Encounter (Signed)
Patient of Dr. Glennon Mac trying to find an anger management group that meets. She isn't having any success with information/#'s found online. Inquiring if we have any information/resources to share with her. Cb#[phone]

## 2021-06-18 ENCOUNTER — Ambulatory Visit: Payer: Federal, State, Local not specified - PPO | Admitting: Occupational Therapy

## 2022-05-06 DIAGNOSIS — K635 Polyp of colon: Secondary | ICD-10-CM | POA: Diagnosis not present

## 2022-05-06 DIAGNOSIS — Z86010 Personal history of colonic polyps: Secondary | ICD-10-CM

## 2022-05-06 DIAGNOSIS — K621 Rectal polyp: Secondary | ICD-10-CM

## 2023-04-05 ENCOUNTER — Other Ambulatory Visit: Payer: Self-pay

## 2023-04-05 ENCOUNTER — Emergency Department
Admission: EM | Admit: 2023-04-05 | Discharge: 2023-04-05 | Discharge status: Against Medical Advice | Payer: Federal, State, Local not specified - PPO | Attending: Emergency Medicine | Admitting: Emergency Medicine

## 2023-04-05 DIAGNOSIS — R109 Unspecified abdominal pain: Secondary | ICD-10-CM | POA: Insufficient documentation

## 2023-04-05 DIAGNOSIS — R197 Diarrhea, unspecified: Secondary | ICD-10-CM | POA: Insufficient documentation

## 2023-04-05 DIAGNOSIS — R1110 Vomiting, unspecified: Secondary | ICD-10-CM | POA: Insufficient documentation

## 2023-04-05 DIAGNOSIS — Z5321 Procedure and treatment not carried out due to patient leaving prior to being seen by health care provider: Secondary | ICD-10-CM | POA: Insufficient documentation

## 2023-04-05 LAB — TYPE AND SCREEN
ABO/RH(D): B POS
Antibody Screen: NEGATIVE

## 2023-04-05 LAB — COMPREHENSIVE METABOLIC PANEL
ALT: 21 U/L (ref 0–44)
AST: 25 U/L (ref 15–41)
Albumin: 4.1 g/dL (ref 3.5–5.0)
Alkaline Phosphatase: 104 U/L (ref 38–126)
Anion gap: 13 (ref 5–15)
BUN: 14 mg/dL (ref 8–23)
CO2: 19 mmol/L — ABNORMAL LOW (ref 22–32)
Calcium: 9 mg/dL (ref 8.9–10.3)
Chloride: 99 mmol/L (ref 98–111)
Creatinine, Ser: 0.7 mg/dL (ref 0.44–1.00)
GFR, Estimated: >60 mL/min (ref >60)
Glucose, Bld: 171 mg/dL — ABNORMAL HIGH (ref 70–99)
Potassium: 3.7 mmol/L (ref 3.5–5.1)
Sodium: 131 mmol/L — ABNORMAL LOW (ref 135–145)
Total Bilirubin: 0.5 mg/dL (ref 0.0–1.2)
Total Protein: 7.4 g/dL (ref 6.5–8.1)

## 2023-04-05 LAB — CBC
HCT: 41.2 % (ref 36.0–46.0)
Hemoglobin: 14.3 g/dL (ref 12.0–15.0)
MCH: 30.8 pg (ref 26.0–34.0)
MCHC: 34.7 g/dL (ref 30.0–36.0)
MCV: 88.8 fL (ref 80.0–100.0)
Platelets: 205 10*3/uL (ref 150–400)
RBC: 4.64 MIL/uL (ref 3.87–5.11)
RDW: 12.6 % (ref 11.5–15.5)
WBC: 15.5 10*3/uL — ABNORMAL HIGH (ref 4.0–10.5)
nRBC: 0 % (ref 0.0–0.2)

## 2023-04-05 NOTE — ED Triage Notes (Signed)
 Had abdominal cramping last night and a large BM, since that time, only rectal bleeding.  Bleeding noted when using the bathroom.

## 2023-04-05 NOTE — ED Provider Triage Note (Signed)
 Emergency Medicine Provider Triage Evaluation Note  Tiffany Kelley , a 63 y.o. female  was evaluated in triage.  Pt complains of 24-hour emesis, abdominal pain described as occurs, diarrhea.  Today patient noticed bright red coming from her rectum.  Patient states being lethargic, unable to eat due to vomit.  Review of Systems  Positive: Negative:   Physical Exam  BP (!) 166/87 (BP Location: Left Arm)   Pulse 89   Temp 98 F (36.7 C) (Oral)   Resp 16   Ht 5' 3 (1.6 m)   Wt 49.9 kg   LMP 07/03/2006   SpO2 96%   BMI 19.49 kg/m  Gen:   Awake, no distress , dehydrated Resp:  Normal effort MSK:   Moves extremities without difficulty  Other:   Medical Decision Making  Medically screening exam initiated at 3:36 PM.  Appropriate orders placed.  Tiffany Kelley was informed that the remainder of the evaluation will be completed by another provider, this initial triage assessment does not replace that evaluation, and the importance of remaining in the ED until their evaluation is complete.    Tiffany Kast, PA-C 04/05/23 1540
